# Patient Record
Sex: Male | Born: 1985 | Hispanic: No | Smoking: Never smoker
Health system: Southern US, Community
[De-identification: ages and names within clinical notes are randomized; demographics above are authoritative.]

## PROBLEM LIST (undated history)

## (undated) DIAGNOSIS — M545 Low back pain, unspecified: Secondary | ICD-10-CM

## (undated) DIAGNOSIS — F32A Depression, unspecified: Secondary | ICD-10-CM

## (undated) DIAGNOSIS — G4700 Insomnia, unspecified: Secondary | ICD-10-CM

## (undated) DIAGNOSIS — F319 Bipolar disorder, unspecified: Secondary | ICD-10-CM

## (undated) DIAGNOSIS — F1910 Other psychoactive substance abuse, uncomplicated: Secondary | ICD-10-CM

## (undated) DIAGNOSIS — F329 Major depressive disorder, single episode, unspecified: Secondary | ICD-10-CM

## (undated) HISTORY — DX: Insomnia, unspecified: G47.00

## (undated) HISTORY — PX: HAND SURGERY: SHX662

## (undated) HISTORY — PX: TOTAL HIP ARTHROPLASTY: SHX124

## (undated) HISTORY — DX: Low back pain, unspecified: M54.50

## (undated) HISTORY — DX: Low back pain: M54.5

---
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
## 2010-03-23 ENCOUNTER — Emergency Department (HOSPITAL_COMMUNITY)
Admission: EM | Admit: 2010-03-23 | Discharge: 2010-03-24 | Payer: Self-pay | Source: Home / Self Care | Admitting: Emergency Medicine

## 2010-06-02 LAB — URINE MICROSCOPIC-ADD ON

## 2010-06-02 LAB — URINALYSIS, ROUTINE W REFLEX MICROSCOPIC
Bilirubin Urine: NEGATIVE
Hgb urine dipstick: NEGATIVE
Protein, ur: 30 mg/dL — AB
Urobilinogen, UA: 0.2 mg/dL (ref 0.0–1.0)

## 2013-08-29 ENCOUNTER — Encounter (INDEPENDENT_AMBULATORY_CARE_PROVIDER_SITE_OTHER): Payer: Self-pay

## 2013-08-29 ENCOUNTER — Encounter: Payer: Self-pay | Admitting: Neurology

## 2013-08-29 ENCOUNTER — Ambulatory Visit (INDEPENDENT_AMBULATORY_CARE_PROVIDER_SITE_OTHER): Payer: Medicare HMO | Admitting: Neurology

## 2013-08-29 VITALS — BP 112/73 | HR 72 | Ht 68.0 in | Wt 237.0 lb

## 2013-08-29 DIAGNOSIS — M79604 Pain in right leg: Secondary | ICD-10-CM | POA: Insufficient documentation

## 2013-08-29 DIAGNOSIS — M545 Low back pain, unspecified: Secondary | ICD-10-CM | POA: Insufficient documentation

## 2013-08-29 DIAGNOSIS — M79609 Pain in unspecified limb: Secondary | ICD-10-CM

## 2013-08-29 DIAGNOSIS — G4700 Insomnia, unspecified: Secondary | ICD-10-CM

## 2013-08-29 MED ORDER — PREGABALIN 100 MG PO CAPS: 100.0000 mg | ORAL_CAPSULE | Freq: Three times a day (TID) | ORAL | Status: DC
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
## 2013-09-04 ENCOUNTER — Telehealth: Payer: Self-pay

## 2013-09-04 NOTE — Telephone Encounter (Signed)
[employer] sent us a letter saying they have approved our request for coverage on Lyrica effective until 03/22/2014 Ref Fax [id number] 10/2012 [id number]  [phone]

## 2013-09-12 ENCOUNTER — Telehealth: Payer: Self-pay

## 2013-09-12 NOTE — Telephone Encounter (Signed)
Dr.Yan needs to do a Peer to Peer [phone] option # 4

## 2013-09-12 NOTE — Telephone Encounter (Signed)
I have done peer to peer review,   Authorization will be faxed to our office. For MRI lumbar

## 2013-09-18 ENCOUNTER — Ambulatory Visit
Admission: RE | Admit: 2013-09-18 | Discharge: 2013-09-18 | Disposition: A | Discharge status: Home / Self Care | Payer: Medicare HMO | Source: Ambulatory Visit | Attending: Neurology | Admitting: Neurology

## 2013-09-18 ENCOUNTER — Ambulatory Visit (INDEPENDENT_AMBULATORY_CARE_PROVIDER_SITE_OTHER): Payer: Medicare HMO | Admitting: Neurology

## 2013-09-18 ENCOUNTER — Encounter (INDEPENDENT_AMBULATORY_CARE_PROVIDER_SITE_OTHER): Payer: Self-pay

## 2013-09-18 DIAGNOSIS — M79604 Pain in right leg: Secondary | ICD-10-CM

## 2013-09-18 DIAGNOSIS — G4700 Insomnia, unspecified: Secondary | ICD-10-CM

## 2013-09-18 DIAGNOSIS — M545 Low back pain, unspecified: Secondary | ICD-10-CM

## 2013-09-18 DIAGNOSIS — M79609 Pain in unspecified limb: Secondary | ICD-10-CM

## 2013-09-18 DIAGNOSIS — Z0289 Encounter for other administrative examinations: Secondary | ICD-10-CM
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
## 2013-09-19 ENCOUNTER — Telehealth: Payer: Self-pay | Admitting: Neurology

## 2013-09-19 NOTE — Telephone Encounter (Signed)
Please call patient, MRI of lumbar spine showed mild multilevel degenerative disc disease, but no significant canal, or foraminal stenosis, I will refer him to pain management   Abnormal MRI lumbar spine (without) demonstrating:  1. At L4-5: disc bulging and facet hypertrophy with mild right foraminal stenosis  2. At L5-S1: small central disc protrusion with potential contact upon descending left S1 root without displacement

## 2013-09-26 NOTE — Telephone Encounter (Signed)
Called pt to inform him per Dr. Terrace ArabiaYan the the pt's MRI of the lumbar spine showed mild multilevel degenerative disc disease, but no significant canal, or foraminal stenosis and that Dr. Terrace ArabiaYan has referred pt to a pain management clinic. I advised the pt that if he has any other problems, questions or concerns to call the office. Pt verbalized understanding.

## 2015-02-22 ENCOUNTER — Encounter (HOSPITAL_COMMUNITY): Payer: Self-pay | Admitting: Emergency Medicine

## 2015-02-22 ENCOUNTER — Emergency Department (HOSPITAL_COMMUNITY)
Admission: EM | Admit: 2015-02-22 | Discharge: 2015-02-22 | Disposition: A | Discharge status: Home / Self Care | Payer: Medicare HMO | Attending: Emergency Medicine | Admitting: Emergency Medicine

## 2015-02-22 DIAGNOSIS — Z791 Long term (current) use of non-steroidal anti-inflammatories (NSAID): Secondary | ICD-10-CM | POA: Diagnosis not present

## 2015-02-22 DIAGNOSIS — G4700 Insomnia, unspecified: Secondary | ICD-10-CM | POA: Insufficient documentation

## 2015-02-22 DIAGNOSIS — Z7951 Long term (current) use of inhaled steroids: Secondary | ICD-10-CM | POA: Diagnosis not present

## 2015-02-22 DIAGNOSIS — Z79899 Other long term (current) drug therapy: Secondary | ICD-10-CM | POA: Diagnosis not present

## 2015-02-22 DIAGNOSIS — L02512 Cutaneous abscess of left hand: Secondary | ICD-10-CM | POA: Diagnosis not present

## 2015-02-22 DIAGNOSIS — IMO0001 Reserved for inherently not codable concepts without codable children: Secondary | ICD-10-CM

## 2015-02-22 DIAGNOSIS — Z87828 Personal history of other (healed) physical injury and trauma: Secondary | ICD-10-CM | POA: Diagnosis not present

## 2015-02-22 DIAGNOSIS — M79645 Pain in left finger(s): Secondary | ICD-10-CM | POA: Diagnosis present

## 2015-02-22 MED ORDER — SULFAMETHOXAZOLE-TRIMETHOPRIM 800-160 MG PO TABS
1.0000 | ORAL_TABLET | Freq: Once | ORAL | Status: AC
  Administered 2015-02-22: 1 via ORAL
  Filled 2015-02-22: qty 1

## 2015-02-22 MED ORDER — HYDROCODONE-ACETAMINOPHEN 5-325 MG PO TABS: 1.0000 | ORAL_TABLET | Freq: Four times a day (QID) | ORAL | Status: DC | PRN

## 2015-02-22 MED ORDER — HYDROCODONE-ACETAMINOPHEN 5-325 MG PO TABS
2.0000 | ORAL_TABLET | Freq: Once | ORAL | Status: AC
Start: 2015-02-22 — End: 2015-02-22
  Administered 2015-02-22: 2 via ORAL
  Filled 2015-02-22: qty 2

## 2015-02-22 MED ORDER — SULFAMETHOXAZOLE-TRIMETHOPRIM 800-160 MG PO TABS: 1.0000 | ORAL_TABLET | Freq: Two times a day (BID) | ORAL | Status: AC

## 2015-02-22 NOTE — Discharge Instructions (Signed)
Please do not eat or drink after 5 AM. Please call Dr. Merrilee SeashoreKuzma's office at 8 AM for an appointment.   Abscess An abscess is an infected area that contains a collection of pus and debris.It can occur in almost any part of the body. An abscess is also known as a furuncle or boil. CAUSES  An abscess occurs when tissue gets infected. This can occur from blockage of oil or sweat glands, infection of hair follicles, or a minor injury to the skin. As the body tries to fight the infection, pus collects in the area and creates pressure under the skin. This pressure causes pain. People with weakened immune systems have difficulty fighting infections and get certain abscesses more often.  SYMPTOMS Usually an abscess develops on the skin and becomes a painful mass that is red, warm, and tender. If the abscess forms under the skin, you may feel a moveable soft area under the skin. Some abscesses break open (rupture) on their own, but most will continue to get worse without care. The infection can spread deeper into the body and eventually into the bloodstream, causing you to feel ill.  DIAGNOSIS  Your caregiver will take your medical history and perform a physical exam. A sample of fluid may also be taken from the abscess to determine what is causing your infection. TREATMENT  Your caregiver may prescribe antibiotic medicines to fight the infection. However, taking antibiotics alone usually does not cure an abscess. Your caregiver may need to make a small cut (incision) in the abscess to drain the pus. In some cases, gauze is packed into the abscess to reduce pain and to continue draining the area. HOME CARE INSTRUCTIONS   Only take over-the-counter or prescription medicines for pain, discomfort, or fever as directed by your caregiver.  If you were prescribed antibiotics, take them as directed. Finish them even if you start to feel better.  If gauze is used, follow your caregiver's directions for changing the  gauze.  To avoid spreading the infection:  Keep your draining abscess covered with a bandage.  Wash your hands well.  Do not share personal care items, towels, or whirlpools with others.  Avoid skin contact with others.  Keep your skin and clothes clean around the abscess.  Keep all follow-up appointments as directed by your caregiver. SEEK MEDICAL CARE IF:   You have increased pain, swelling, redness, fluid drainage, or bleeding.  You have muscle aches, chills, or a general ill feeling.  You have a fever. MAKE SURE YOU:   Understand these instructions.  Will watch your condition.  Will get help right away if you are not doing well or get worse.   This information is not intended to replace advice given to you by your health care provider. Make sure you discuss any questions you have with your health care provider.   Document Released: 12/17/2004 Document Revised: 09/08/2011 Document Reviewed: 05/22/2011 Elsevier Interactive Patient Education [employer]2016 Elsevier Inc.

## 2015-02-22 NOTE — ED Notes (Signed)
EDP explained plan of care to pt.  

## 2015-02-22 NOTE — ED Notes (Signed)
Pt. presents with left index finger skin infection with swelling and drainage for several days , pt. stated it was accidentally cut with a knife 3 weeks ago.

## 2015-02-22 NOTE — ED Provider Notes (Signed)
By signing my name below, I, Tanda Rockers, attest that this documentation has been prepared under the direction and in the presence of [employer], DO. Electronically Signed: Tanda Rockers, ED Scribe. 02/22/2015. 2:25 AM.  TIME SEEN: 2:14 AM  CHIEF COMPLAINT: Finger pain  HPI:  Thomas [name] is a 29 y.o. male who is right hand dominant presents to the Emergency Department complaining of gradual onset, constant, left index finger pain, swelling, and drainage x 1 week. Pt states that he cut his finger approximately 3-4 weeks ago with a knife while cleaning a deer and had sutures placed at [facility]. He had his sutures taken out on 02/14/2015 (approximately 1 week ago) by his PCP and states that at that time it was slightly red. His PCP scheduled him to follow-up with an orthopedic physician at [facility] in [facility] and he has an appointment on December 15.  He was not started on antibiotics. States that his finger began to swell more over the next several days and was erythematous and began to drain. Denies fever, chills, nausea, vomiting or diarrhea. No hx DM or IV drug use. States he is having pain with flexion and extension of this finger.  ROS: See HPI Constitutional: no fever, chills Eyes: no drainage  ENT: no runny nose   Cardiovascular:  no chest pain  Resp: no SOB  GI: no vomiting GU: no dysuria Integumentary: swelling and drainage to left index finger. no rash  Allergy: no hives  Musculoskeletal: pain to left index finger. no leg swelling  Neurological: no slurred speech ROS otherwise negative  PAST MEDICAL HISTORY/PAST SURGICAL HISTORY:  Past Medical History  Diagnosis Date  . LBP (low back pain)   . Insomnia     MEDICATIONS:  Prior to Admission medications   Medication Sig Start Date End Date Taking? Authorizing Provider  amphetamine-dextroamphetamine (ADDERALL) 10 MG tablet 10 mg daily. 08/05/13   Historical Provider, MD  fluticasone  (FLONASE) 50 MCG/ACT nasal spray 50 sprays. 07/05/13   Historical Provider, MD  meloxicam (MOBIC) 15 MG tablet 15 mg daily. 08/04/13   Historical Provider, MD  pregabalin (LYRICA) 100 MG capsule Take 1 capsule (100 mg total) by mouth 3 (three) times daily. 08/29/13   Levert Feinstein, MD  temazepam (RESTORIL) 15 MG capsule 15 capsules daily. 07/05/13   Historical Provider, MD  traMADol (ULTRAM) 50 MG tablet 50 mg daily. 08/04/13   Historical Provider, MD    ALLERGIES:  No Known Allergies  SOCIAL HISTORY:  Social History  Substance Use Topics  . Smoking status: Never Smoker   . Smokeless tobacco: Never Used  . Alcohol Use: No     Comment: Quit 2014    FAMILY HISTORY: Family History  Problem Relation Age of Onset  . Heart disease Maternal Grandmother   . Heart disease Maternal Grandfather   . Sleep apnea Mother   . Diabetes Mother     EXAM: Triage Vitals: BP 122/92 mmHg  Pulse 79  Temp(Src) 98.1 F (36.7 C) (Oral)  Resp 18  SpO2 97%   CONSTITUTIONAL: Alert and oriented and responds appropriately to questions. Well-appearing; well-nourished HEAD: Normocephalic EYES: Conjunctivae clear, PERRL ENT: normal nose; no rhinorrhea; moist mucous membranes; pharynx without lesions noted NECK: Supple, no meningismus, no LAD  CARD: RRR; S1 and S2 appreciated; no murmurs, no clicks, no rubs, no gallops RESP: Normal chest excursion without splinting or tachypnea; breath sounds clear and equal bilaterally; no wheezes, no rhonchi, no rales, no hypoxia  or respiratory distress, speaking full sentences ABD/GI: Normal bowel sounds; non-distended; soft, non-tender, no rebound, no guarding, no peritoneal signs BACK:  The back appears normal and is non-tender to palpation, there is no CVA tenderness EXT: Left second digit over the PIP joint there is a swollen, fluctuant, erythematous, warm area with a punctate lesion with purulent drainage. He has difficulty fully flexing and extending finger secondary to  pain. No tenderness over flexor tendon of that digit. 2+ radial pulse on the left. normal capillary refill; no cyanosis, no calf tenderness or swelling, no bony tenderness or bony injury on exam    SKIN: Normal color for age and race; warm NEURO: Moves all extremities equally, sensation to light touch intact diffusely, cranial nerves II through XII intact PSYCH: The patient's mood and manner are appropriate. Grooming and personal hygiene are appropriate.        MEDICAL DECISION MAKING: Patient here with abscess over the PIP of the left second digit that is actively draining. Discussed with Dr. Betha LoaKevin Kuzma on call for hand surgery. He recommends starting the patient on Bactrim and placing him in a splint. He recommend having the patient return to his office in the morning for further intervention. We'll keep patient NPO after 5 AM. Patient last ate [employer] prior to arrival. [facility] 10 Vicodin tablets. Provided work note until Monday 02/25/15.  Discussed with patient that he must follow-up with the surgeon today. He verbalizes understanding and states he is comfortable with this plan.    I personally performed the services described in this documentation, which was scribed in my presence. The recorded information has been reviewed and is accurate.    Layla MawKristen N Ward, DO 02/22/15 [id number]

## 2016-02-27 ENCOUNTER — Emergency Department (HOSPITAL_COMMUNITY)
Admission: EM | Admit: 2016-02-27 | Discharge: 2016-02-27 | Disposition: A | Discharge status: Other Acute Inpatient Hospital | Payer: Medicare Other | Attending: Emergency Medicine | Admitting: Emergency Medicine

## 2016-02-27 ENCOUNTER — Encounter (HOSPITAL_COMMUNITY): Payer: Self-pay

## 2016-02-27 DIAGNOSIS — F1410 Cocaine abuse, uncomplicated: Secondary | ICD-10-CM | POA: Diagnosis not present

## 2016-02-27 DIAGNOSIS — F332 Major depressive disorder, recurrent severe without psychotic features: Secondary | ICD-10-CM | POA: Diagnosis present

## 2016-02-27 DIAGNOSIS — R45851 Suicidal ideations: Secondary | ICD-10-CM | POA: Diagnosis present

## 2016-02-27 DIAGNOSIS — F1419 Cocaine abuse with unspecified cocaine-induced disorder: Secondary | ICD-10-CM | POA: Diagnosis present

## 2016-02-27 DIAGNOSIS — Z79899 Other long term (current) drug therapy: Secondary | ICD-10-CM | POA: Diagnosis not present

## 2016-02-27 LAB — COMPREHENSIVE METABOLIC PANEL
ALBUMIN: 4.3 g/dL (ref 3.5–5.0)
ALK PHOS: 60 U/L (ref 38–126)
ALT: 69 U/L — AB (ref 17–63)
AST: 38 U/L (ref 15–41)
Anion gap: 8 (ref 5–15)
BUN: 15 mg/dL (ref 6–20)
CALCIUM: 9.3 mg/dL (ref 8.9–10.3)
CO2: 26 mmol/L (ref 22–32)
CREATININE: 1.06 mg/dL (ref 0.61–1.24)
Chloride: 103 mmol/L (ref 101–111)
GFR calc Af Amer: >60 mL/min (ref >60)
GFR calc non Af Amer: >60 mL/min (ref >60)
GLUCOSE: 105 mg/dL — AB (ref 65–99)
Potassium: 3.8 mmol/L (ref 3.5–5.1)
SODIUM: 137 mmol/L (ref 135–145)
Total Bilirubin: 0.6 mg/dL (ref 0.3–1.2)
Total Protein: 7.3 g/dL (ref 6.5–8.1)

## 2016-02-27 LAB — CBC
HEMATOCRIT: 42.2 % (ref 39.0–52.0)
HEMOGLOBIN: 14.5 g/dL (ref 13.0–17.0)
MCH: 30 pg (ref 26.0–34.0)
MCHC: 34.4 g/dL (ref 30.0–36.0)
MCV: 87.4 fL (ref 78.0–100.0)
Platelets: 219 10*3/uL (ref 150–400)
RBC: 4.83 MIL/uL (ref 4.22–5.81)
RDW: 12.7 % (ref 11.5–15.5)
WBC: 8.7 10*3/uL (ref 4.0–10.5)

## 2016-02-27 LAB — RAPID URINE DRUG SCREEN, HOSP PERFORMED
Amphetamines: NOT DETECTED
BARBITURATES: NOT DETECTED
Benzodiazepines: NOT DETECTED
Cocaine: POSITIVE — AB
Opiates: NOT DETECTED
TETRAHYDROCANNABINOL: POSITIVE — AB

## 2016-02-27 LAB — ACETAMINOPHEN LEVEL: Acetaminophen (Tylenol), Serum: <10 ug/mL — ABNORMAL LOW (ref 10–30)

## 2016-02-27 LAB — SALICYLATE LEVEL: Salicylate Lvl: <7 mg/dL (ref 2.8–30.0)

## 2016-02-27 LAB — ETHANOL: Alcohol, Ethyl (B): <5 mg/dL (ref <5)

## 2016-02-27 MED ORDER — LORAZEPAM 1 MG PO TABS: 1.0000 mg | ORAL_TABLET | Freq: Three times a day (TID) | ORAL | Status: DC | PRN

## 2016-02-27 MED ORDER — ZOLPIDEM TARTRATE 5 MG PO TABS: 5.0000 mg | ORAL_TABLET | Freq: Every evening | ORAL | Status: DC | PRN

## 2016-02-27 MED ORDER — ONDANSETRON HCL 4 MG PO TABS: 4.0000 mg | ORAL_TABLET | Freq: Three times a day (TID) | ORAL | Status: DC | PRN

## 2016-02-27 MED ORDER — NICOTINE 21 MG/24HR TD PT24: 21.0000 mg | MEDICATED_PATCH | Freq: Every day | TRANSDERMAL | Status: DC

## 2016-02-27 MED ORDER — ACETAMINOPHEN 325 MG PO TABS: 650.0000 mg | ORAL_TABLET | ORAL | Status: DC | PRN

## 2016-02-27 MED ORDER — ALUM & MAG HYDROXIDE-SIMETH 200-200-20 MG/5ML PO SUSP: 30.0000 mL | ORAL | Status: DC | PRN

## 2016-02-27 MED ORDER — IBUPROFEN 200 MG PO TABS: 600.0000 mg | ORAL_TABLET | Freq: Three times a day (TID) | ORAL | Status: DC | PRN

## 2016-02-27 NOTE — BH Assessment (Signed)
[facility]  Per Jacki ConesLaurie, NP - patient meets criteria for inpatient hospitalization once he is medically cleared.  Per NP, [facility] to using [facility] yesterday.    Writer informed the Charge Nurse Johnny Bridge(Martha) that [facility] once [facility] is medically [facility].

## 2016-02-27 NOTE — ED Provider Notes (Signed)
[facility]-EMERGENCY DEPT Provider Note   CSN: [id number] Arrival date & time: 02/27/16  1329     History   Chief Complaint Chief Complaint  Patient presents with  . Suicidal  . Drug Problem    HPI Thomas [name] is a 30 y.o. male.  Pt presents to the ED with suicidal ideation and crack abuse.  Pt said that he has been using crack for 2 months and he can't quit by himself.  He feels suicidal due to inability to stop using.  Pt denies a plan.  He has not tried to hurt himself.      Past Medical History:  Diagnosis Date  . Insomnia   . LBP (low back pain)     Patient Active Problem List   Diagnosis Date Noted  . Right leg pain 08/29/2013  . LBP (low back pain)   . Insomnia     Past Surgical History:  Procedure Laterality Date  . HAND SURGERY Right   . TOTAL HIP ARTHROPLASTY Right        Home Medications    Prior to Admission medications   Medication Sig Start Date End Date Taking? Authorizing Provider  pregabalin (LYRICA) 100 MG capsule Take 1 capsule (100 mg total) by mouth 3 (three) times daily. Patient not taking: Reported on 02/27/2016 08/29/13   Levert FeinsteinYijun Yan, MD    Family History Family History  Problem Relation Age of Onset  . Sleep apnea Mother   . Diabetes Mother   . Heart disease Maternal Grandmother   . Heart disease Maternal Grandfather     Social History Social History  Substance Use Topics  . Smoking status: Never Smoker  . Smokeless tobacco: Never Used  . Alcohol use No     Comment: Quit 2014     Allergies   Patient has no known allergies.   Review of Systems Review of Systems  Psychiatric/Behavioral: Positive for suicidal ideas.  All other systems reviewed and are negative.    Physical Exam Updated Vital Signs BP 125/69 (BP Location: Left Arm)   Pulse 81   Temp 98.2 F (36.8 C) (Oral)   Resp 20   SpO2 96%   Physical Exam  Constitutional: He is oriented to person, place, and time. He appears well-developed and  well-nourished.  HENT:  Head: Normocephalic and atraumatic.  Right Ear: External ear normal.  Left Ear: External ear normal.  Nose: Nose normal.  Mouth/Throat: Oropharynx is clear and moist.  Eyes: Conjunctivae and EOM are normal. Pupils are equal, round, and reactive to light.  Neck: Normal range of motion. Neck supple.  Cardiovascular: Normal rate, regular rhythm, normal heart sounds and intact distal pulses.   Pulmonary/Chest: Effort normal and breath sounds normal.  Abdominal: Soft. Bowel sounds are normal.  Musculoskeletal: Normal range of motion.  Neurological: He is alert and oriented to person, place, and time.  Skin: Skin is warm.  Psychiatric: He has a normal mood and affect. His behavior is normal. Judgment and thought content normal.  Nursing note and vitals reviewed.    ED Treatments / Results  Labs (all labs ordered are listed, but only abnormal results are displayed) Labs Reviewed  COMPREHENSIVE METABOLIC PANEL - Abnormal; Notable for the following:       Result Value   Glucose, Bld 105 (*)    ALT 69 (*)    All other components within normal limits  ACETAMINOPHEN LEVEL - Abnormal; Notable for the following:    Acetaminophen (Tylenol), Serum <10 (*)  All other components within normal limits  ETHANOL  SALICYLATE LEVEL  CBC  RAPID URINE DRUG SCREEN, HOSP PERFORMED    EKG  EKG Interpretation None       Radiology No results found.  Procedures Procedures (including critical care time)  Medications Ordered in ED Medications  LORazepam (ATIVAN) tablet 1 mg (not administered)  acetaminophen (TYLENOL) tablet 650 mg (not administered)  ibuprofen (ADVIL,MOTRIN) tablet 600 mg (not administered)  zolpidem (AMBIEN) tablet 5 mg (not administered)  nicotine (NICODERM CQ - dosed in mg/24 hours) patch 21 mg (not administered)  ondansetron (ZOFRAN) tablet 4 mg (not administered)  alum & mag hydroxide-simeth (MAALOX/MYLANTA) 200-200-20 MG/5ML suspension 30 mL  (not administered)     Initial Impression / Assessment and Plan / ED Course  I have reviewed the triage vital signs and the nursing notes.  Pertinent labs & imaging results that were available during my care of the patient were reviewed by me and considered in my medical decision making (see chart for details).  Clinical Course    Pt is here with crack abuse.  The pt reports SI, so I will have TTS eval.  Pt placed in a psych hold.  Final Clinical Impressions(s) / ED Diagnoses   Final diagnoses:  Cocaine abuse  Suicidal ideations    New Prescriptions New Prescriptions   No medications on file     Jacalyn LefevreJulie Paytience Bures, MD 02/27/16 1517

## 2016-02-27 NOTE — ED Notes (Signed)
Patient noted in room. No complaints, stable, in no acute distress. Q15 minute rounds and monitoring via Security Cameras to continue.  

## 2016-02-27 NOTE — BH Assessment (Signed)
Tele Assessment Note   Thomas [name] is an 30 y.o. male that reports suicidal ideation with a plan to cut his wrist.  Patient reports that he is addicted to cocaine.  Patient reports increased depression with the inability to stop using cocaine.  Patient reports feeling hopeless and he is not able to contract for safety.   Patient reports that his last use of cocaine was yesterday.  Patient reports using cocaine on a daily basis.  Patient reports that he has been experiencing withdrawal symptoms.  Patient denies seizures.  Patient denies prior inpatient or outpatient mental health therapy.  Patient denies prior inpatient psychiatric hospitalization.  Patient denies prior outpatient psychiatric medication management or mental health therapy.    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 Insomnia   . LBP (low back pain)     Past Surgical History:  Procedure Laterality Date  . HAND SURGERY Right   . TOTAL HIP ARTHROPLASTY Right     Family History:  Family History  Problem Relation Age of Onset  . Heart disease Maternal Grandmother   . Heart disease Maternal Grandfather   . Sleep apnea Mother   . Diabetes Mother     Social History:  reports that he has never smoked. He has never used smokeless tobacco. He reports that he does not drink alcohol or use drugs.  Additional Social History:  Alcohol / Drug Use History of alcohol / drug use?: Yes Longest period of sobriety (when/how long): Patient reports a couple of days Negative Consequences of Use: Work / Programmer, multimediachool, Copywriter, advertisingersonal relationships, Surveyor, quantityinancial Withdrawal Symptoms: Irritability, Tingling, Nausea / Vomiting, Patient aware of relationship between substance abuse and physical/medical complications, Blackouts, Weakness Substance #1 Name of Substance 1: Cocaine 1 - Age of First Use: 18 1 - Amount (size/oz): Varies on how much  money he has 1 - Frequency: Daily 1 - Duration: For the past couple of years, per the patient  1 - Last Use / Amount: Last night - he used $80 worth   CIWA:   COWS:    PATIENT STRENGTHS: (choose at least two) Average or above average intelligence Capable of independent living Communication skills  Allergies: No Known Allergies  Home Medications:  (Not in a hospital admission)  OB/GYN Status:  No LMP for male patient.  General Assessment Data [facility] TTS Assessment: In system Is this a Tele or Face-to-Face Assessment?: Face-to-Face Is this an Initial Assessment or a Re-assessment for this encounter?: Initial Assessment Marital status: Single Maiden name: NA Is patient pregnant?: No Pregnancy Status: No Living Arrangements: Alone Can pt return to current living arrangement?: Yes Admission Status: Voluntary Is patient capable of signing voluntary admission?: Yes Referral Source: Self/Family/Friend Insurance type: Medicare  Medical Screening Exam [facility] ONLY) Medical Exam completed: Yes Elta Guadeloupe(Laurie Parks, NP - completed MSE Exam )  Crisis Care Plan Living Arrangements: Alone Legal Guardian:  (NA) Name of Psychiatrist: None Reported Name of Therapist: None Reported  Education Status Is patient currently in school?: No Current Grade: NA Highest grade of school patient has completed: NA Name of school: NA Contact person: NA  Risk to self with the past 6 months Suicidal Ideation: Yes-Currently Present Has patient been a risk to self within the past 6 months prior to admission? : Yes Suicidal Intent: Yes-Currently Present Has patient had any suicidal intent within  the past 6 months prior to admission? : Yes Is patient at risk for suicide?: Yes Suicidal Plan?: Yes-Currently Present Has patient had any suicidal plan within the past 6 months prior to admission? : Yes Specify Current Suicidal Plan: Plan to cut  his wrist Access to Means: Yes Specify Access to Suicidal Means: Knife What has been your use of drugs/alcohol within the last 12 months?: Cocaine Previous Attempts/Gestures: No How many times?: 0 Other Self Harm Risks: None Reported Triggers for Past Attempts:  (NA) Intentional Self Injurious Behavior: None Family Suicide History: No Recent stressful life event(s): Other (Comment) (Unable to stip using drugs) Persecutory voices/beliefs?: No Depression: Yes Depression Symptoms: Despondent, Insomnia, Tearfulness, Isolating, Fatigue, Guilt, Loss of interest in usual pleasures, Feeling angry/irritable, Feeling worthless/self pity Substance abuse history and/or treatment for substance abuse?: Yes Suicide prevention information given to non-admitted patients: Not applicable  Risk to Others within the past 6 months Homicidal Ideation: No Does patient have any lifetime risk of violence toward others beyond the six months prior to admission? : No Thoughts of Harm to Others: No Current Homicidal Intent: No Current Homicidal Plan: No Access to Homicidal Means: No Identified Victim: None Reported History of harm to others?: No Assessment of Violence: None Noted Violent Behavior Description: None Reported Does patient have access to weapons?: No Criminal Charges Pending?: No Does patient have a court date: No Is patient on probation?: No  Psychosis Hallucinations: None noted Delusions: None noted  Mental Status Report Appearance/Hygiene: Disheveled Eye Contact: Fair Motor Activity: Freedom of movement Speech: Logical/coherent Level of Consciousness: Alert Mood: Depressed Affect: Anxious, Depressed, Blunted, Irritable Anxiety Level: Minimal Thought Processes: Coherent, Relevant Judgement: Impaired Orientation: Person, Place, Time, Situation Obsessive Compulsive Thoughts/Behaviors: None  Cognitive Functioning Concentration: Decreased Memory: Recent Intact, Remote Intact IQ:  Average Insight: Poor Impulse Control: Poor Appetite: Fair Weight Loss: 0 Weight Gain: 0 Sleep: Decreased Total Hours of Sleep: 4 Vegetative Symptoms: Decreased grooming, Not bathing, Staying in bed  ADLScreening [facility] Assessment Services) Patient's cognitive ability adequate to safely complete daily activities?: Yes Patient able to express need for assistance with ADLs?: Yes Independently performs ADLs?: Yes (appropriate for developmental age)  Prior Inpatient Therapy Prior Inpatient Therapy: No Prior Therapy Dates: NA Prior Therapy Facilty/Provider(s): NA Reason for Treatment: NA  Prior Outpatient Therapy Prior Outpatient Therapy: No Prior Therapy Dates: NA Prior Therapy Facilty/Provider(s): NA Reason for Treatment: NA Does patient have an ACCT team?: No Does patient have Intensive In-House Services?  : No Does patient have [facility] services? : No Does patient have P4CC services?: No  ADL Screening (condition at time of admission) Patient's cognitive ability adequate to safely complete daily activities?: Yes Is the patient deaf or have difficulty hearing?: No Does the patient have difficulty seeing, even when wearing glasses/contacts?: No Does the patient have difficulty concentrating, remembering, or making decisions?: No Patient able to express need for assistance with ADLs?: Yes Does the patient have difficulty dressing or bathing?: No Independently performs ADLs?: Yes (appropriate for developmental age) Does the patient have difficulty walking or climbing stairs?: No Weakness of Legs: None Weakness of Arms/Hands: None  Home Assistive Devices/Equipment Home Assistive Devices/Equipment: None    Abuse/Neglect Assessment (Assessment to be complete while patient is alone) Physical Abuse: Denies Verbal Abuse: Denies Sexual Abuse: Denies Exploitation of patient/patient's resources: Denies Self-Neglect: Denies Values / Beliefs Cultural Requests During  Hospitalization: None Spiritual Requests During Hospitalization: None Consults Spiritual Care Consult Needed: No Social Work Consult Needed: No Merchant navy officer (For Healthcare)  Does Patient Have a Medical Advance Directive?: No Would patient like information on creating a medical advance directive?: No - Patient declined    Additional Information 1:1 In Past 12 Months?: No CIRT Risk: No Elopement Risk: No Does patient have medical clearance?: No  Child/Adolescent Assessment Running Away Risk: Denies  Disposition: Per Elta GuadeloupeLaurie Parks, NP - patient meets criteria for inpatient hospitalization.  [facility].    Disposition Initial Assessment Completed for this Encounter: Yes Disposition of Patient: Inpatient treatment program (Per Camelia EngLaure, patient meets criteria for inpt hosp) Type of inpatient treatment program: Adult  Thomas [name], Thomas [name] 02/27/2016 1:35 PM

## 2016-02-27 NOTE — H&P (Signed)
Behavioral Health Medical Screening Exam  Thomas [name] is an 30 y.o. male.  Total Time spent with patient: 20 minutes  Psychiatric Specialty Exam: Physical Exam  Constitutional: He is oriented to person, place, and time. He appears well-developed and well-nourished.  HENT:  Head: Normocephalic.  Neck: Normal range of motion.  Cardiovascular: Normal rate and normal heart sounds.   Respiratory: Effort normal and breath sounds normal.  GI: Soft. Bowel sounds are normal.  Musculoskeletal: Normal range of motion.  Neurological: He is alert and oriented to person, place, and time.  Skin: Skin is warm and dry.    Review of Systems  Psychiatric/Behavioral: Positive for depression, substance abuse and suicidal ideas. Negative for hallucinations and memory loss. The patient is nervous/anxious. The patient does not have insomnia.     Blood pressure 133/77, pulse 82, temperature 98.1 F (36.7 C), temperature source Oral, resp. rate 18, SpO2 98 %.There is no height or weight on file to calculate BMI.  General Appearance: Casual and Fairly Groomed  Eye Contact:  Good  Speech:  Clear and Coherent and Normal Rate  Volume:  Normal  Mood:  Depressed, Hopeless and Worthless  Affect:  Congruent, Depressed, Flat and Tearful  Thought Process:  Coherent, Goal Directed and Linear  Orientation:  Full (Time, Place, and Person)  Thought Content:  Logical  Suicidal Thoughts:  Yes.  with intent/plan  Homicidal Thoughts:  No  Memory:  Immediate;   Good Recent;   Fair Remote;   Fair  Judgement:  Fair  Insight:  Fair  Psychomotor Activity:  Normal  Concentration: Concentration: Good and Attention Span: Good  Recall:  Good  Fund of Knowledge:Good  Language: Good  Akathisia:  No  Handed:  Right  AIMS (if indicated):     Assets:  Communication Skills Desire for Improvement Financial Resources/Insurance Housing Leisure Time Resilience Social Support Transportation  Sleep:        Musculoskeletal: Strength & Muscle Tone: within normal limits Gait & Station: normal Patient leans: N/A  Blood pressure 133/77, pulse 82, temperature 98.1 F (36.7 C), temperature source Oral, resp. rate 18, SpO2 98 %.  Recommendations:  Based on my evaluation the patient appears to have an emergency medical condition for which I recommend the patient be transferred to the emergency department for further evaluation.   Pt meet criteria for inpatient psychiatric admission  Laveda AbbeLaurie Britton Kolton Kienle, NP 02/27/2016, 5:56 PM

## 2016-02-27 NOTE — ED Notes (Signed)
Pt admitted to room #42. Pt behavior calm and cooperative, pleasant on approach. Pt reports he came to the hospital d/t "drug addiction to crack cocaine." Pt endorsing SI. "If I don't get no help I have a plan." Pt denies HI. Denies AVH. Pt identifies current stressor as "my grandma dying." Special checks q 15 mins in place for safety. Video monitoring in place.

## 2016-02-27 NOTE — ED Triage Notes (Signed)
Per pt, has a drug addiction of crack/cocaine.  Using for 2 months.  Thoughts of hurting self for approx 1 month.  No plan.  States if he "doesn't get help, there will be a plan".

## 2016-02-27 NOTE — ED Provider Notes (Signed)
[facility] accepted by Dr. Mckinley JewelATES. I personally evaluated the patient and felt he was safe for transfer. Patient denied any acute physical complaints.   Nira ConnPedro Eduardo Cardama, MD 02/28/16 [phone]

## 2016-02-28 ENCOUNTER — Encounter (HOSPITAL_COMMUNITY): Payer: Self-pay

## 2016-02-28 ENCOUNTER — Inpatient Hospital Stay (HOSPITAL_COMMUNITY)
Admission: RE | Admit: 2016-02-28 | Discharge: 2016-03-03 | DRG: 885 | Disposition: A | Discharge status: Home / Self Care | Payer: Medicare Other | Attending: Psychiatry | Admitting: Psychiatry

## 2016-02-28 DIAGNOSIS — Z833 Family history of diabetes mellitus: Secondary | ICD-10-CM | POA: Diagnosis not present

## 2016-02-28 DIAGNOSIS — Z79899 Other long term (current) drug therapy: Secondary | ICD-10-CM | POA: Diagnosis not present

## 2016-02-28 DIAGNOSIS — Z96641 Presence of right artificial hip joint: Secondary | ICD-10-CM | POA: Diagnosis present

## 2016-02-28 DIAGNOSIS — F1419 Cocaine abuse with unspecified cocaine-induced disorder: Secondary | ICD-10-CM | POA: Diagnosis present

## 2016-02-28 DIAGNOSIS — R45851 Suicidal ideations: Secondary | ICD-10-CM | POA: Diagnosis present

## 2016-02-28 DIAGNOSIS — F332 Major depressive disorder, recurrent severe without psychotic features: Principal | ICD-10-CM | POA: Diagnosis present

## 2016-02-28 DIAGNOSIS — Z8249 Family history of ischemic heart disease and other diseases of the circulatory system: Secondary | ICD-10-CM

## 2016-02-28 DIAGNOSIS — Z9889 Other specified postprocedural states: Secondary | ICD-10-CM | POA: Diagnosis not present

## 2016-02-28 MED ORDER — MAGNESIUM HYDROXIDE 400 MG/5ML PO SUSP: 30.0000 mL | Freq: Every day | ORAL | Status: DC | PRN

## 2016-02-28 MED ORDER — TRAZODONE HCL 50 MG PO TABS
50.0000 mg | ORAL_TABLET | Freq: Every evening | ORAL | Status: DC | PRN
  Administered 2016-02-28: 50 mg via ORAL
  Filled 2016-02-28: qty 1

## 2016-02-28 MED ORDER — ALUM & MAG HYDROXIDE-SIMETH 200-200-20 MG/5ML PO SUSP: 30.0000 mL | ORAL | Status: DC | PRN

## 2016-02-28 MED ORDER — ACETAMINOPHEN 325 MG PO TABS
650.0000 mg | ORAL_TABLET | Freq: Four times a day (QID) | ORAL | Status: DC | PRN
  Administered 2016-02-28 – 2016-03-02 (×3): 650 mg via ORAL
  Filled 2016-02-28 (×3): qty 2

## 2016-02-28 MED ORDER — HYDROXYZINE HCL 25 MG PO TABS
25.0000 mg | ORAL_TABLET | Freq: Four times a day (QID) | ORAL | Status: DC | PRN
  Administered 2016-03-01: 25 mg via ORAL
  Filled 2016-02-28: qty 1

## 2016-02-28 NOTE — Progress Notes (Signed)
Report received from admitting RN.  Introduced self to pt.  Pt denies SI/HI, denies hallucinations, denies pain.  He verbally contracts for safety.  Pt is safe on the unit and he reports he will inform staff of needs and concerns.  Will continue to monitor and assess.

## 2016-02-28 NOTE — Progress Notes (Signed)
Patient attended wrap-up group and said that her day was a 4.  Something positive that happened today was she woke up.
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
## 2016-02-28 NOTE — Progress Notes (Signed)
D:  Per pt self inventory pt reports sleeping fair, appetite good, energy level low, ability to pay attention good, rates depression at a 5 out of 10, hopelessness at a 5 out of 10, anxiety at a 0 out of 10, denies SI/HI/AVH, goal today: "to get myself straight, I think being here will help", sad/depressed during interaction.     A:  Emotional support provided, Encouraged pt to continue with treatment plan and attend all group activities, q15 min checks maintained for safety.  R:  Pt is receptive, going to groups, pleasant and cooperative with staff and other patients on the unit.

## 2016-02-28 NOTE — [facility] Group Notes (Signed)
[facility] LCSW Group Therapy  02/28/2016 12:58 PM  Type of Therapy:  Group Therapy  Participation Level:  Did Not Attend-pt invited. Chose to rest in room.   Modes of Intervention:  Discussion, Education, Exploration, Problem-solving, Rapport Building, Socialization and Support  Summary of Progress/Problems: Feelings around Relapse. Group members discussed the meaning of relapse and shared personal stories of relapse, how it affected them and others, and how they perceived themselves during this time. Group members were encouraged to identify triggers, warning signs and coping skills used when facing the possibility of relapse. Social supports were discussed and explored in detail. Post Acute Withdrawal Syndrome (handout provided) was introduced and examined. Pt's were encouraged to ask questions, talk about key points associated with PAWS, and process this information in terms of relapse prevention.   Thomas [name] N Smart LCSW 02/28/2016, 12:58 PM  

## 2016-02-28 NOTE — Progress Notes (Signed)
Recreation Therapy Notes  Date: 02/28/16 Time: 0930 Location: 300 Hall Group Room  Group Topic: Stress Management  Goal Area(s) Addresses:  Patient will verbalize importance of using healthy stress management.  Patient will identify positive emotions associated with healthy stress management.   Intervention: Calm App  Activity :  Forgiveness Meditation.  LRT introduced the stress management technique of meditation.  LRT played a meditation on forgiveness to allow patients to engage in the meditation.  Patients were to follow along to with the meditation to engage in the technique.  Education:  Stress Management, Discharge Planning.   Education Outcome: Acknowledges edcuation/In group clarification offered/Needs additional education  Clinical Observations/Feedback: Pt did not attend group.   Caroll RancherMarjette Chay Mazzoni, LRT/CTRS         Caroll RancherLindsay, Breyanna Valera A 02/28/2016 11:49 AM

## 2016-02-28 NOTE — Progress Notes (Signed)
Patient ID: Thomas [name], male   DOB: 01/26/1986, 30 y.o.   MRN: [id number] PER STATE REGULATIONS 482.30  THIS CHART WAS REVIEWED FOR MEDICAL NECESSITY WITH RESPECT TO THE PATIENT'S ADMISSION/DURATION OF STAY.  NEXT REVIEW DATE: 03/03/16  Loura HaltBARBARA Cara Aguino, RN, BSN CASE MANAGER

## 2016-02-28 NOTE — [facility] Suicide Risk Assessment (Signed)
[facility]   Nursing information obtained from:    Demographic factors:    Current Mental Status:    Loss Factors:    Historical Factors:    Risk Reduction Factors:     Total Time spent with patient: 30 minutes Principal Problem: Cocaine abuse with cocaine-induced disorder [facility](HCC) Diagnosis:   Patient Active Problem List   Diagnosis Date Noted  . MDD (major depressive disorder), recurrent severe, without psychosis ([facility]) [F33.2] 02/27/2016  . 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 Right leg pain [M79.604] 08/29/2013  . LBP (low back pain) [M54.5]   . Insomnia [G47.00]    Subjective Data: A she denies current suicidal or homicidal ideation, plan or intent.  Continued Clinical Symptoms:  Alcohol Use Disorder Identification Test Final Score (AUDIT): 0 The "Alcohol Use Disorders Identification Test", Guidelines for Use in Primary Care, Second Edition.  World Science writerHealth Organization [facility](WHO). Score between 0-7:  no or low risk or alcohol related problems. Score between 8-15:  moderate risk of alcohol related problems. Score between 16-19:  high risk of alcohol related problems. Score 20 or above:  warrants further diagnostic evaluation for alcohol dependence and treatment.   CLINICAL FACTORS:   Alcohol/Substance Abuse/Dependencies   Musculoskeletal: Strength & Muscle Tone: within normal limits Gait & Station: normal Patient leans: N/A  Psychiatric Specialty Exam: Physical Exam  ROS  Blood pressure 138/81, pulse 81, temperature 98.2 F (36.8 C), temperature source Oral, resp. rate 16, height 5\' 10"  (1.778 m), weight 114.3 kg (252 lb), SpO2 100 %.Body mass index is 36.16 kg/m.    General Appearance: Casual  Eye Contact:  Good  Speech:  Clear and Coherent  Volume:  Normal  Mood:  Anxious  Affect:  Congruent  Thought Process:  Coherent  Orientation:  Negative  Thought Content:  Negative  Suicidal Thoughts:  No   Homicidal Thoughts:  No  Memory:  Negative  Judgement:  Fair  Insight:  Fair  Psychomotor Activity:  Normal  Concentration:  Concentration: Good  Recall:  Good  Fund of Knowledge:  Good  Language:  Good  Akathisia:  No  Handed:  Right  AIMS (if indicated):     Assets:  Resilience  ADL's:  Intact  Cognition:  WNL  Sleep:  Number of Hours: 3.5    COGNITIVE FEATURES THAT CONTRIBUTE TO RISK:  None    SUICIDE RISK:   Mild:  Suicidal ideation of limited frequency, intensity, duration, and specificity.  There are no identifiable plans, no associated intent, mild dysphoria and related symptoms, good self-control (both objective and subjective assessment), few other risk factors, and identifiable protective factors, including available and accessible social support.   PLAN OF CARE: see PAA  I certify that inpatient services furnished can reasonably be expected to improve the patient's condition.  Acquanetta SitElizabeth Woods Rio Taber, MD 02/28/2016, 12:15 PM

## 2016-02-28 NOTE — Plan of Care (Signed)
Problem: Safety: Goal: Periods of time without injury will increase Outcome: Progressing Pt. remains a low fall risk, denies SI/HI/AVH at this time, Q 15 checks in effect.    
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
## 2016-02-28 NOTE — Progress Notes (Signed)
D: Pt. is up and visible in the milieu, seen talking on the phone in the dayroom. Denies having any SI/HI/AVH/Pain at this time. Pt. presents with an animated affect and mood. Pt. states " I just want to go to sleep early". Pt. remains cooperative & pleasant on the unit with no issues noted.   A: Encouragement and support given. No Meds. ordered at this time. PRN Trazodone requested and given. Will re-eval as necessary.   R: 1:1 interaction in private. Safety maintained with Q 15 checks. Continues to follow treatment plan and will monitor closely. No other questions/concerns at this time.

## 2016-02-28 NOTE — Tx Team (Signed)
Interdisciplinary Treatment and Diagnostic Plan Update  02/28/2016 Time of Session: 9;30AM Thomas [name] MRN: [id number]  Principal Diagnosis: Cocaine abuse with cocaine-induced disorder [facility](HCC)  Secondary Diagnoses: Principal Problem:   Cocaine abuse with cocaine-induced disorder (HCC)   Current Medications:  Current Facility-Administered Medications  Medication Dose Route Frequency Provider Last Rate Last Dose  . acetaminophen (TYLENOL) tablet 650 mg  650 mg Oral Q6H PRN Laveda AbbeLaurie Britton Parks, NP      . alum & mag hydroxide-simeth (MAALOX/MYLANTA) 200-200-20 MG/5ML suspension 30 mL  30 mL Oral Q4H PRN Laveda AbbeLaurie Britton Parks, NP      . hydrOXYzine (ATARAX/VISTARIL) tablet 25 mg  25 mg Oral Q6H PRN Laveda AbbeLaurie Britton Parks, NP      . magnesium hydroxide (MILK OF MAGNESIA) suspension 30 mL  30 mL Oral Daily PRN Laveda AbbeLaurie Britton Parks, NP      . traZODone (DESYREL) tablet 50 mg  50 mg Oral QHS PRN Laveda AbbeLaurie Britton Parks, NP       PTA Medications: Prescriptions Prior to Admission  Medication Sig Dispense Refill Last Dose  . pregabalin (LYRICA) 100 MG capsule Take 1 capsule (100 mg total) by mouth 3 (three) times daily. (Patient not taking: Reported on 02/27/2016) 90 capsule 5 Not Taking at Unknown time    Patient Stressors: Financial difficulties Substance abuse  Patient Strengths: Capable of independent living Barrister's clerkCommunication skills Motivation for treatment/growth Supportive family/friends  Treatment Modalities: Medication Management, Group therapy, Case management,  1 to 1 session with clinician, Psychoeducation, Recreational therapy.   Physician Treatment Plan for Primary Diagnosis: Cocaine abuse with cocaine-induced disorder (HCC) Long Term Goal(s): Improvement in symptoms so as ready for discharge Improvement in symptoms so as ready for discharge   Short Term Goals: Ability to demonstrate self-control will improve Ability to identify changes in lifestyle to reduce recurrence of  condition will improve Ability to identify triggers associated with substance abuse/mental health issues will improve  Medication Management: Evaluate patient's response, side effects, and tolerance of medication regimen.  Therapeutic Interventions: 1 to 1 sessions, Unit Group sessions and Medication administration.  Evaluation of Outcomes: Progressing  Physician Treatment Plan for Secondary Diagnosis: Principal Problem:   Cocaine abuse with cocaine-induced disorder (HCC)  Long Term Goal(s): Improvement in symptoms so as ready for discharge Improvement in symptoms so as ready for discharge   Short Term Goals: Ability to demonstrate self-control will improve Ability to identify changes in lifestyle to reduce recurrence of condition will improve Ability to identify triggers associated with substance abuse/mental health issues will improve     Medication Management: Evaluate patient's response, side effects, and tolerance of medication regimen.  Therapeutic Interventions: 1 to 1 sessions, Unit Group sessions and Medication administration.  Evaluation of Outcomes: Progressing   RN Treatment Plan for Primary Diagnosis: Cocaine abuse with cocaine-induced disorder (HCC) Long Term Goal(s): Knowledge of disease and therapeutic regimen to maintain health will improve  Short Term Goals: Ability to remain free from injury will improve, Ability to disclose and discuss suicidal ideas and Ability to identify and develop effective coping behaviors will improve  Medication Management: RN will administer medications as ordered by provider, will assess and evaluate patient's response and provide education to patient for prescribed medication. RN will report any adverse and/or side effects to prescribing provider.  Therapeutic Interventions: 1 on 1 counseling sessions, Psychoeducation, Medication administration, Evaluate responses to treatment, Monitor vital signs and CBGs as ordered, Perform/monitor  CIWA, COWS, AIMS and Fall Risk screenings as ordered, Perform wound care treatments as  ordered.  Evaluation of Outcomes: Progressing   LCSW Treatment Plan for Primary Diagnosis: Cocaine abuse with cocaine-induced disorder (HCC) Long Term Goal(s): Safe transition to appropriate next level of care at discharge, Engage patient in therapeutic group addressing interpersonal concerns.  Short Term Goals: Engage patient in aftercare planning with referrals and resources, Facilitate patient progression through stages of change regarding substance use diagnoses and concerns and Identify triggers associated with mental health/substance abuse issues  Therapeutic Interventions: Assess for all discharge needs, 1 to 1 time with Social worker, Explore available resources and support systems, Assess for adequacy in community support network, Educate family and significant other(s) on suicide prevention, Complete Psychosocial Assessment, Interpersonal group therapy.  Evaluation of Outcomes: Progressing   Progress in Treatment: Attending groups: No. New to unit. Continuing to assess. Participating in groups: No. Taking medication as prescribed: Yes. Toleration medication: Yes. Family/Significant other contact made: No, will contact:  family member if patient consents Patient understands diagnosis: Yes. Discussing patient identified problems/goals with staff: Yes. Medical problems stabilized or resolved: Yes. Denies suicidal/homicidal ideation: Yes. Issues/concerns per patient self-inventory: No. Other: n/a  New problem(s) identified: No, Describe:  n/a  New Short Term/Long Term Goal(s): medication stabilization; detox; development of comprehensive mental wellness/sobriety plan.   Discharge Plan or Barriers:  CSW assessing for appropriate referrals. Pt denies any prior inpatient or outpatient substance abuse treatment or mental health care.   Reason for Continuation of Hospitalization:  Depression Medication stabilization Withdrawal symptoms  Estimated Length of Stay: 3-5 days   Attendees: Patient: 02/28/2016 1:00 PM  Physician: Dr. Mckinley Jewelates MD 02/28/2016 1:00 PM  Nursing: Sharalyn InkPenny, Patty RN 02/28/2016 1:00 PM  RN Care Manager: Onnie BoerJennifer Clark CM 02/28/2016 1:00 PM  Social Worker: Chartered loss adjusterHeather Smart, LCSW 02/28/2016 1:00 PM  Recreational Therapist:  02/28/2016 1:00 PM  Other: Gray BernhardtMay Augustin NP; Armandina StammerAgnes Nwoko NP 02/28/2016 1:00 PM  Other:  02/28/2016 1:00 PM  Other: 02/28/2016 1:00 PM    Scribe for Treatment Team: Ledell PeoplesHeather N Smart, LCSW 02/28/2016 1:00 PM

## 2016-02-28 NOTE — Tx Team (Signed)
Initial Treatment Plan 02/28/2016 1:28 AM Toma Copierhomas Seth Retz FAO:[id number]    PATIENT STRESSORS: Financial difficulties Substance abuse   PATIENT STRENGTHS: Capable of independent living Communication skills Motivation for treatment/growth Supportive family/friends   PATIENT IDENTIFIED PROBLEMS: MDD  "I get sad and cry a lot"  DI  "I tried to cut myself with a knife but didn't do it"  Substance Abuse  "I use crake and smoke weed, I need help to stop"           DISCHARGE CRITERIA:  Ability to meet basic life and health needs Adequate post-discharge living arrangements Improved stabilization in mood, thinking, and/or behavior Medical problems require only outpatient monitoring Motivation to continue treatment in a less acute level of care Safe-care adequate arrangements made  PRELIMINARY DISCHARGE PLAN: Attend 12-step recovery group Outpatient therapy Return to previous living arrangement  PATIENT/FAMILY INVOLVEMENT: This treatment plan has been presented to and reviewed with the patient, Thomas [name]. The patient has been given the opportunity to ask questions and make suggestions.  Sylvan CheeseSteven M Monicia Tse, RN 02/28/2016, 1:28 AM

## 2016-02-29 DIAGNOSIS — Z9889 Other specified postprocedural states: Secondary | ICD-10-CM

## 2016-02-29 MED ORDER — DOXEPIN HCL 25 MG PO CAPS
25.0000 mg | ORAL_CAPSULE | Freq: Every day | ORAL | Status: DC
  Administered 2016-02-29 – 2016-03-01 (×2): 25 mg via ORAL
  Filled 2016-02-29 (×4): qty 1

## 2016-02-29 MED ORDER — CITALOPRAM HYDROBROMIDE 10 MG PO TABS
10.0000 mg | ORAL_TABLET | Freq: Every day | ORAL | Status: DC
  Administered 2016-02-29 – 2016-03-02 (×3): 10 mg via ORAL
  Filled 2016-02-29 (×7): qty 1

## 2016-02-29 MED ORDER — MELOXICAM 7.5 MG PO TABS
7.5000 mg | ORAL_TABLET | Freq: Every day | ORAL | Status: DC
  Administered 2016-02-29 – 2016-03-03 (×4): 7.5 mg via ORAL
  Filled 2016-02-29 (×8): qty 1

## 2016-02-29 MED ORDER — LIDOCAINE 5 % EX PTCH
1.0000 | MEDICATED_PATCH | CUTANEOUS | Status: DC
Start: 2016-02-29 — End: 2016-03-03
  Administered 2016-02-29 – 2016-03-02 (×3): 1 via TRANSDERMAL
  Filled 2016-02-29 (×6): qty 1

## 2016-02-29 NOTE — BHH Group Notes (Signed)
Adult Psychoeducational Group Note  Date:  02/29/2016 Time:  0915am  Group Topic/Focus:  Goals Group:   The focus of this group is to help patients establish daily goals to achieve during treatment and discuss how the patient can incorporate goal setting into their daily lives to aide in recovery.   Participation Level:  Did Not Attend   Lauris Poagndrea B Clary Meeker 02/29/2016, 10:16 AM

## 2016-02-29 NOTE — BHH Group Notes (Signed)
Adult Psychoeducational Group Note  Date:  02/29/2016 Time:  2:19 PM  Group Topic/Focus:  Positive thinking--follow up from am group  Participation Level:  Did Not Attend  Lauris Poagndrea B Khristine Verno 02/29/2016, 2:19 PM

## 2016-02-29 NOTE — Progress Notes (Signed)
D: Pt. is up and visible in the milieu, finished with shower and was going to the dayroom. Denies having any SI/HI/AVH/Pain at this time. Pt. presents with an animated affect and mood. Pt. states " I just want to go to sleep early". Pt. does appear to be anxious at times. Pt. remains cooperative & pleasant on the unit with no issues noted.   A: Encouragement and support given. New meds. started and education given to Pt. Meds. ordered and given.   R: 1:1 interaction in private. Safety maintained with Q 15 checks. Continues to follow treatment plan and will monitor closely. No other questions/concerns at this time

## 2016-02-29 NOTE — Plan of Care (Signed)
Problem: Activity: Goal: Interest or engagement in activities will improve Outcome: Progressing Pt. is slowly improving in s/s, is going out more into the dayroom and interacting with others.

## 2016-02-29 NOTE — Progress Notes (Signed)
D:  Per pt self inventory pt reports sleeping good, appetite fair, energy level normal, ability to pay attention good, rates depression at a 2 out of 10, hopelessness at a 4 out of 10, anxiety at a 0 out of 10, denies SI/HI/AVH, goal today: "to get better, talk about things", depressed/flat during interaction.     A:  Emotional support provided, Encouraged pt to continue with treatment plan and attend all group activities, q15 min checks maintained for safety.  R:  Pt has not been going to groups, otherwise pleasant and cooperative with staff and other patients on the unit.

## 2016-02-29 NOTE — BHH Group Notes (Signed)
Adult Therapy Group Note  Date:  02/29/2016  Time:  10:00-11:00AM  Group Topic/Focus: Unhealthy vs Healthy Coping Techniques  Building Self Esteem:   The focus of this group was to assist patients in becoming aware of the differences between healthy and unhealthy coping techniques, as well as how to determine which type they are using.  The benefits of learning healthier coping were discussed.   An exercise was used to identify patients' fears and to demonstrate that in general, their fears are commonly experienced.  This helped patients to provide support to each other and to determine that, in fact, they are not alone.   The whiteboard was utilized to record the discussion, and many patients copied this down.  Participation Level:  Minimal  Participation Quality:  Attentive  Affect:  Not Congruent  Cognitive:  Not assessed  Insight: Improving  Engagement in Group:  only present 10 minutes  Modes of Intervention:  Exercise, Discussion and Support  Additional Comments:  The patient was only present for the last 10 minutes of group, listened attentively but did not participate.  Ambrose MantleMareida Grossman-Orr, LCSW 02/29/2016   12:35pm
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
## 2016-03-01 NOTE — Progress Notes (Signed)
Adult Psychoeducational Group Note  Date:  02/29/2016 Time:  09:32pm  Group Topic/Focus:  Wrap-Up Group:   The focus of this group is to help patients review their daily goal of treatment and discuss progress on daily workbooks.   Participation Level:  Active  Participation Quality:  Appropriate  Affect:  Appropriate  Cognitive:  Appropriate  Insight: Appropriate  Engagement in Group:  Engaged  Modes of Intervention:  Discussion  Additional Comments:  Patient attended wrap-up group and he said that his day was a 6.  His goal for today was to start the healing process and it is now in process.  His coping skills were coloring, socializing, and watching tv.  Theresia Pree W Orpah Hausner 02/29/2016, 9:32pm

## 2016-03-01 NOTE — Progress Notes (Signed)
D.  Pt pleasant on approach, denies complaints at this time.  Pt's wife came up and collected his belongings from locker with his permission except his cell phone which he did not want her to have.  Pt was positive for evening AA group, observed interacting appropriately with peers on the unit.  Pt denies SI/HI/hallucinations at this time.  A.  Support and encouragement offered, medication given as ordered  R.  Pt remains safe on the unit, will continue to monitor.
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
## 2016-03-01 NOTE — Progress Notes (Signed)
Patient denies SI, HI and AVH.  Patient reports feeling better then when he first came in.  Patient was noted to be in the day room watching tv.  Patient had no incidents of behavioral dyscontrol.   Assess patient for safety, offer medications as prescribed, engage patient in 1:1 staff talks.   Continue to monitor as planned.

## 2016-03-01 NOTE — Progress Notes (Signed)
Patient attended AA group meeting.  

## 2016-03-01 NOTE — [facility] Group Notes (Signed)
[facility] Group Notes: (Clinical Social Work)   03/01/2016      Type of Therapy:  Group Therapy   Participation Level:  Did Not Attend despite MHT prompting   Casady Voshell Grossman-Orr, LCSW 03/01/2016, 12:08 PM     

## 2016-03-01 NOTE — Plan of Care (Signed)
Problem: Activity: Goal: Interest or engagement in activities will improve Outcome: Progressing Pt did attend evening AA group   

## 2016-03-02 MED ORDER — HYDROXYZINE HCL 50 MG PO TABS
50.0000 mg | ORAL_TABLET | Freq: Every evening | ORAL | Status: DC | PRN
  Administered 2016-03-02: 50 mg via ORAL
  Filled 2016-03-02 (×7): qty 1

## 2016-03-02 NOTE — Progress Notes (Signed)
[facility]  03/02/2016 12:11 PM  Patient Active Problem List   Diagnosis Date Noted  . MDD (major depressive disorder), recurrent severe, without psychosis (HCC) 02/27/2016  . Cocaine abuse with cocaine-induced disorder (HCC) 02/27/2016  . Right leg pain 08/29/2013  . LBP (low back pain)   . Insomnia     Diagnosis: Cocaine use disorder  Subjective: Patient denies any acute complaints denies any current suicidal or homicidal ideation, plan or intent  Objective: Well-developed well-nourished man in no apparent distress pleasant and appropriate. Mood is all right affect is euthymic thought processes linear and goal-directed thought content no current suicidal or homicidal ideation, plan or intent alert and oriented 3 IQ appears average range insight and judgment are fair         Current Facility-Administered Medications (Analgesics):  .  acetaminophen (TYLENOL) tablet 650 mg .  meloxicam (MOBIC) tablet 7.5 mg     Current Facility-Administered Medications (Other):  .  alum & mag hydroxide-simeth (MAALOX/MYLANTA) 200-200-20 MG/5ML suspension 30 mL .  hydrOXYzine (ATARAX/VISTARIL) tablet 25 mg .  lidocaine (LIDODERM) 5 % 1 patch .  magnesium hydroxide (MILK OF MAGNESIA) suspension 30 mL  No current outpatient prescriptions on file.  Vital Signs:Blood pressure 133/82, pulse 74, temperature 97.7 F (36.5 C), temperature source Oral, resp. rate 18, height 5\' 10"  (1.778 m), weight 114.3 kg (252 lb), SpO2 100 %.    Lab Results: No results found for this or any previous visit (from the past 48 hour(s)).  Physical Findings: AIMS: Facial and Oral Movements Muscles of Facial Expression: None, normal Lips and Perioral Area: None, normal Jaw: None, normal Tongue: None, normal,Extremity Movements Upper (arms, wrists, hands, fingers): None, normal Lower (legs, knees, ankles, toes): None, normal, Trunk Movements Neck, shoulders, hips: None, normal, Overall  Severity Severity of abnormal movements (highest score from questions above): None, normal Incapacitation due to abnormal movements: None, normal Patient's awareness of abnormal movements (rate only patient's report): No Awareness, Dental Status Current problems with teeth and/or dentures?: No Does patient usually wear dentures?: No  CIWA:    COWS:  COWS Total Score: 2   Assessment/Plan: Patient was started on Celexa over the weekend and apparently is listed to expire today and I will let it expire. patient was also ordered Sinequan which I do not use for sleep and this will be discontinued. We will assess patient for need for any long-term treatment for mood disorders or need for sleeping agents and adjust treatment as needed. Patient appears to have long-standing dependent traits and does seem comfortable here in this institutional environment however his participation is somewhat variable area patient was reminded that this is a short-term stay and we need to proceed in the next day or 2 to arrange for his further treatment with planned discharge in the next day or so.  Acquanetta SitElizabeth Woods Oates, MD 03/02/2016, 12:11 PM

## 2016-03-02 NOTE — Progress Notes (Signed)
D:  Per pt self inventory pt reports sleeping good, appetite good, energy level low, ability to pay attention good, rates depression at a 6 out of 10, hopelessness at a 3 out of 10, anxiety at a 2 out of 10, denies SI/HI/AVH, goal today: "working on a plan to get better.", depressed during interaction.     A:  Emotional support provided, Encouraged pt to continue with treatment plan and attend all group activities, q15 min checks maintained for safety.  R:  Pt is not very vested in treatment, only going to some groups--needs continued encouragement to participate, otherwise pleasant and cooperative with staff and other patients on the unit.

## 2016-03-02 NOTE — [facility] Group Notes (Signed)
  [facility] LCSW Group Therapy  03/02/2016 2:27 PM  Type of Therapy:  Group Therapy  Participation Level:  Did Not Attend-pt invited. Chose to remain in bed.   Summary of Progress/Problems: Today's Topic: Overcoming Obstacles. Patients identified one short term goal and potential obstacles in reaching this goal. Patients processed barriers involved in overcoming these obstacles. Patients identified steps necessary for overcoming these obstacles and explored motivation (internal and external) for facing these difficulties head on.   Vendetta Pittinger N Smart LCSW 03/02/2016, 2:27 PM

## 2016-03-02 NOTE — Progress Notes (Signed)
Recreation Therapy Notes  Date: 03/02/16 Time: 0930 Location: [address] Dayroom  Group Topic: Stress Management  Goal Area(s) Addresses:  Patient will verbalize importance of using healthy stress management.  Patient will identify positive emotions associated with healthy stress management.   Intervention: Guided Imagery  Activity :  Depression Imagery.  LRT introduced the the stress management technique of guided imagery.  LRT read a script to allow patients to engage in the technique of guided imagery.  Patients were to follow along as LRT read script to engage in the technique.  Education:  Stress Management, Discharge Planning.   Education Outcome: Acknowledges edcuation/In group clarification offered/Needs additional education  Clinical Observations/Feedback: Pt did not attend group.    Caroll RancherMarjette Egbert Seidel, LRT/CTRS         Caroll RancherLindsay, Thadeus Gandolfi A 03/02/2016 12:27 PM

## 2016-03-02 NOTE — [facility] Group Notes (Signed)
Thomas [name] was invited to group.  Did not attend.    Spiritual care group on grief and loss facilitated by chaplain Burnis KingfisherMatthew Taevion Sikora   Group opened with brief discussion and psycho-social ed around grief and loss in relationships and in relation to self - identifying life patterns, circumstances, changes that cause losses. Established group norm of speaking from own life experience. Group goal of establishing open and affirming space for members to share loss and experience with grief, normalize grief experience and provide psycho social education and grief support.

## 2016-03-02 NOTE — BHH Suicide Risk Assessment (Signed)
[facility]   Principal Problem: Cocaine abuse with cocaine-induced disorder [facility](HCC) Discharge Diagnoses:  Patient Active Problem List   Diagnosis Date Noted  . MDD (major depressive disorder), recurrent severe, without psychosis (HCC) [F33.2] 02/27/2016  . 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 Right leg pain [M79.604] 08/29/2013  . LBP (low back pain) [M54.5]   . Insomnia [G47.00]     Total Time spent with patient: 15 minutes  Musculoskeletal: Strength & Muscle Tone: within normal limits Gait & Station: normal Patient leans: N/A  Psychiatric Specialty Exam: ROS  Blood pressure 133/82, pulse 74, temperature 97.7 F (36.5 C), temperature source Oral, resp. rate 18, height 5\' 10"  (1.778 m), weight 114.3 kg (252 lb), SpO2 100 %.Body mass index is 36.16 kg/m.   General Appearance: Casual  Eye Contact::  Good  Speech:  Clear and Coherent  Volume:  Normal  Mood:  Euthymic  Affect:  Congruent  Thought Process:  Coherent  Orientation:  Negative  Thought Content:  Negative  Suicidal Thoughts:  No  Homicidal Thoughts:  No  Memory:  Negative  Judgement:  Fair  Insight:  Fair  Psychomotor Activity:  Normal  Concentration:  Good  Recall:  Good  Fund of Knowledge:Good  Language: Good  Akathisia:  No  Handed:  Right  AIMS (if indicated):     Assets:  Resilience    Cognition: WNL  ADL's:  Intact     Mental Status Per Nursing Assessment::   On Admission:     Demographic Factors:  Male  Loss Factors: NA  Historical Factors: NA  Risk Reduction Factors:   Living with another person, especially a relative and Positive social support  Continued Clinical Symptoms:  Alcohol/Substance Abuse/Dependencies  Cognitive Features That Contribute To Risk:  None    Suicide Risk:  Mild:  Suicidal ideation of limited frequency, intensity, duration, and specificity.  There are no identifiable plans, no associated  intent, mild dysphoria and related symptoms, good self-control (both objective and subjective assessment), few other risk factors, and identifiable protective factors, including available and accessible social support.  Follow-up Information    [facility] Follow up on 03/04/2016.   Why:  Appt for hospital follow-up and assessment at 12:15PM ont his date. Please bring picture ID, social security card, medicare card, and proof of household income if you have it. Thank you.  Contact information: [address] [place] [address] [phone]        [facility] Follow up.   Why:  Referral faxed: 02/28/16. If you are still interested in pursuing this treatment program, please call Shayla in admissions daily at 9:00AM to check status of referral and waitlist. Thank you.  Contact information: [address] [place], [place] [address] Phone: [phone] Fax: [phone]          Plan Of Care/Follow-up recommendations:  Other:  Patient denies current suicidal or homicidal ideation, plan or intent. He should follow-up with any needed psychiatric or substance use treatment after discharge.  Acquanetta SitElizabeth Woods Dollene Mallery, MD 03/02/2016, 3:41 PM
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
## 2016-03-03 MED ORDER — HYDROXYZINE HCL 25 MG PO TABS: 25.0000 mg | ORAL_TABLET | Freq: Four times a day (QID) | ORAL | 0 refills | Status: DC | PRN

## 2016-03-03 MED ORDER — MELOXICAM 7.5 MG PO TABS: 7.5000 mg | ORAL_TABLET | Freq: Every day | ORAL | 0 refills | Status: DC

## 2016-03-03 NOTE — Discharge Summary (Signed)
Physician Discharge Summary Note  Patient:  Thomas [name] is an 30 y.o., male MRN:  [id number] DOB:  Aug 16, 1985 Patient phone:  [phone] (home)  Patient address:   [address] [place] [address],  Total Time spent with patient: 30 minutes  Date of Admission:  02/28/2016 Date of Discharge: 03/03/2016  Reason for Admission:  suicidal ideations  Principal Problem: Cocaine abuse with cocaine-induced disorder [facility](HCC) Discharge Diagnoses: Patient Active Problem List   Diagnosis Date Noted  . MDD (major depressive disorder), recurrent severe, without psychosis (HCC) [F33.2] 02/27/2016  . 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 Right leg pain [M79.604] 08/29/2013  . LBP (low back pain) [M54.5]   . Insomnia [G47.00]     Past Psychiatric History: see HPI  Past Medical History:  Past Medical History:  Diagnosis Date  . Insomnia   . LBP (low back pain)     Past Surgical History:  Procedure Laterality Date  . HAND SURGERY Right   . TOTAL HIP ARTHROPLASTY Right    Family History:  Family History  Problem Relation Age of Onset  . Sleep apnea Mother   . Diabetes Mother   . Heart disease Maternal Grandmother   . Heart disease Maternal Grandfather    Family Psychiatric  History: see HPI Social History:  History  Alcohol Use No    Comment: Quit 2014     History  Drug Use  . Types: Cocaine    Social History   Social History  . Marital status: Unknown    Spouse name: N/A  . Number of children: N/A  . Years of education: 10th   Occupational History  .  Unemployed    disabled   Social History Main Topics  . Smoking status: Never Smoker  . Smokeless tobacco: Never Used  . Alcohol use No     Comment: Quit 2014  . Drug use:     Types: Cocaine  . Sexual activity: Yes    Birth control/ protection: Condom   Other Topics Concern  . None   Social History Narrative   Patient lives at home with her Marchia MeiersFiance Wilkie Aye(Kristy)     Disabled.   Caffeine eight cans of diet mountain dews.   Education 10 th grade.    Hospital Course:  Thomas [name] is an 30 y.o. male that reported suicidal ideation with a plan to cut his wrist.  Patient reported that he was addicted to cocaine.  Patient reported increased depression with the inability to stop using cocaine.    Thomas [name] was admitted for Cocaine abuse with cocaine-induced disorder [facility](HCC) and crisis management.  Patient was treated with medications with their indications listed below in detail under Medication List.  Medical problems were identified and treated as needed.  Home medications were restarted as appropriate.  Improvement was monitored by observation and Thomas Paganinihomas Seth [name] daily report of symptom reduction.  Emotional and mental status was monitored by daily self inventory reports completed by Thomas Paganinihomas Seth [name] and clinical staff.  Patient reported continued improvement, denied any new concerns.  Patient had been compliant on medications and denied side effects.  Support and encouragement was provided.    Thomas [name] was evaluated by the treatment team for stability and plans for continued recovery upon discharge.  Patient was offered further treatment options upon discharge including Residential, Intensive Outpatient and Outpatient treatment. Patient will follow up with agency listed below for medication management and counseling.  Encouraged patient  to maintain satisfactory support network and home environment.  Advised to adhere to medication compliance and outpatient treatment follow up.  Prescriptions provided.       Thomas [name] motivation was an integral factor for scheduling further treatment.  Employment, transportation, bed availability, health status, family support, and any pending legal issues were also considered during patient's hospital stay.  Upon completion of this admission the patient was both mentally and medically stable for  discharge denying suicidal/homicidal ideation, auditory/visual/tactile hallucinations, delusional thoughts and paranoia.      Physical Findings: AIMS: Facial and Oral Movements Muscles of Facial Expression: None, normal Lips and Perioral Area: None, normal Jaw: None, normal Tongue: None, normal,Extremity Movements Upper (arms, wrists, hands, fingers): None, normal Lower (legs, knees, ankles, toes): None, normal, Trunk Movements Neck, shoulders, hips: None, normal, Overall Severity Severity of abnormal movements (highest score from questions above): None, normal Incapacitation due to abnormal movements: None, normal Patient's awareness of abnormal movements (rate only patient's report): No Awareness, Dental Status Current problems with teeth and/or dentures?: No Does patient usually wear dentures?: No  CIWA:    COWS:  COWS Total Score: 2  Musculoskeletal: [name] & Muscle Tone: within normal limits Gait & Station: normal Patient leans: N/A  Psychiatric Specialty Exam: Physical Exam  Nursing note and vitals reviewed. Psychiatric: He has a normal mood and affect. Judgment and thought content normal. He is aggressive. Thought content is not paranoid. He expresses no homicidal and no suicidal ideation.    Review of Systems  Constitutional: Negative.   HENT: Negative.   Eyes: Negative.   Respiratory: Negative.   Cardiovascular: Negative.   Gastrointestinal: Negative.   Genitourinary: Negative.   Musculoskeletal: Negative.   Skin: Negative.   Neurological: Negative.   Endo/Heme/Allergies: Negative.   Psychiatric/Behavioral: Negative.     Blood pressure (!) 118/97, pulse 62, temperature 97.5 F (36.4 C), temperature source Oral, resp. rate 18, height 5\' 10"  (1.778 m), weight 114.3 kg (252 lb), SpO2 100 %.Body mass index is 36.16 kg/m.    Have you used any form of tobacco in the last 30 days? (Cigarettes, Smokeless Tobacco, Cigars, and/or Pipes): No  Has this patient used any  form of tobacco in the last 30 days? (Cigarettes, Smokeless Tobacco, Cigars, and/or Pipes) Yes, N/A  Blood Alcohol level:  Lab Results  Component Value Date   ETH <5 02/27/2016    Metabolic Disorder Labs:  No results found for: HGBA1C, MPG No results found for: PROLACTIN No results found for: CHOL, TRIG, HDL, CHOLHDL, VLDL, LDLCALC  See Psychiatric Specialty Exam and Suicide Risk Assessment completed by Attending Physician prior to discharge.  Discharge destination:  Home  Is patient on multiple antipsychotic therapies at discharge:  No   Has Patient had three or more failed trials of antipsychotic monotherapy by history:  No  Recommended Plan for Multiple Antipsychotic Therapies: NA     Medication List    STOP taking these medications   pregabalin 100 MG capsule Commonly known as:  LYRICA     TAKE these medications     Indication  hydrOXYzine 25 MG tablet Commonly known as:  ATARAX/VISTARIL Take 1 tablet (25 mg total) by mouth every 6 (six) hours as needed for anxiety.  Indication:  Anxiety Neurosis   meloxicam 7.5 MG tablet Commonly known as:  MOBIC Take 1 tablet (7.5 mg total) by mouth daily. Start taking on:  03/04/2016  Indication:  Joint Damage causing Pain and Loss of Function  Follow-up Information    [facility] Follow up on 03/04/2016.   Why:  Appt for hospital follow-up and assessment at 12:15PM ont his date. Please bring picture ID, social security card, medicare card, and proof of household income if you have it. Thank you.  Contact information: [address] [place] [address] [phone]        [facility] Follow up.   Why:  Referral faxed: 02/28/16. If you are still interested in pursuing this treatment program, please call Shayla in admissions daily at 9:00AM to check status of referral and waitlist. Thank you.  Contact information: [address] [place], [place] [address] Phone: [phone] Fax: [phone]           Follow-up recommendations:  Activity:  as tol Diet:  as tol  Comments:  1.  Take all your medications as prescribed.   2.  Report any adverse side effects to outpatient provider. 3.  Patient instructed to not use alcohol or illegal drugs while on prescription medicines. 4.  In the event of worsening symptoms, instructed patient to call 911, the crisis hotline or go to nearest emergency room for evaluation of symptoms.  Signed: Lindwood QuaSheila May [place], NP [facility] 03/03/2016, 11:03 AM

## 2016-03-03 NOTE — Progress Notes (Signed)
  [facility] :  Will you be returning to the same living situation after discharge:  Yes,  home At discharge, do you have transportation home?: Yes,  gf picking him up Do you have the ability to pay for your medications: Yes,  Medicare  Release of information consent forms completed and submitted to medical records by CSW.  Patient to Follow up at: Follow-up Information    [facility] Follow up on 03/04/2016.   Why:  Appt for hospital follow-up and assessment at 12:15PM ont his date. Please bring picture ID, social security card, medicare card, and proof of household income if you have it. Thank you.  Contact information: [address] [place] [address] [phone]        [facility] Follow up.   Why:  Referral faxed: 02/28/16. If you are still interested in pursuing this treatment program, please call Shayla in admissions daily at 9:00AM to check status of referral and waitlist. Thank you.  Contact information: [address] [place], [place] [address] Phone: [phone] Fax: [phone]          Next level of care provider has access to [facility] Link:no  Safety Planning and Suicide Prevention discussed: Yes,  SPE completed with pt's gf.  Have you used any form of tobacco in the last 30 days? (Cigarettes, Smokeless Tobacco, Cigars, and/or Pipes): No  Has patient been referred to the Quitline?: N/A patient is not a smoker  Patient has been referred for addiction treatment: Yes  Ledell PeoplesHeather N Smart LCSW 03/03/2016, 9:18 AM

## 2016-03-03 NOTE — Progress Notes (Signed)
Patient was discharged per order. AVS, medications, scripts, transition summary, and suicide safety plan were all reviewed with patient. Pt was given an opportunity to ask questions and verbalized understanding of all discharge paperwork. Belongings were returned, and patient signed for receipt. Patient verbalized readiness for discharge and appeared in no acute distress when escorted to lobby.  

## 2016-03-03 NOTE — Progress Notes (Signed)
D: Emmanual denied SI, HI, and AVH this a.m. He complained of chronic lower back pain, for which he received meloxicam with some relief. He rated his feelings of depression, hopelessness, and anxiety all 3/10 on his self inventory. He also reported good sleep, appetite, and concentration, but rated his energy level as low. He didn't go to a.m goals group but did attend afternoon group. He voiced readiness for discharge.   A: Meds given as ordered. Q15 safety checks maintained. Support/encouragement offered.  R: Pt remains free from harm and continues with treatment. Will continue to monitor for needs/safety.

## 2016-03-03 NOTE — Progress Notes (Signed)
D: Pt was in the day room upon initial approach.  Pt presents with appropriate affect and depressed mood.  He reports his goal is "to get better" and he reports he feels as though he has accomplished this goal.  Pt denies SI/HI, denies hallucinations, reports chronic back pain of 9/10.  Pt has been visible in milieu interacting with peers and staff appropriately.   A: Introduced self to pt.  Actively listened to pt and offered support and encouragement. Medications administered per order.  PRN medication administered for pain. R: Pt is safe on the unit.  Pt is compliant with medications.  Pt verbally contracts for safety.  Will continue to monitor and assess.

## 2016-03-03 NOTE — BHH Group Notes (Signed)
BHH Group Notes:  (Nursing/MHT/Case Management/Adjunct)  Date:  03/03/2016  Time:  0900  Type of Therapy:  Nurse Education  Participation Level:  Did Not Attend  Summary of Progress/Problems: Pt was invited but did not attend.   Maurine SimmeringShugart, Jazlyn Tippens M 03/03/2016, 10:39 AM

## 2016-03-03 NOTE — Tx Team (Signed)
Interdisciplinary Treatment and Diagnostic Plan Update  03/03/2016 Time of Session: 9:30AM Thomas [name] MRN: [id number]  Principal Diagnosis: Cocaine abuse with cocaine-induced disorder [facility])  Secondary Diagnoses: Principal Problem:   Cocaine abuse with cocaine-induced disorder ([place]) Active Problems:   MDD (major depressive disorder), recurrent severe, without psychosis ([place])   Current Medications:  Current Facility-Administered Medications  Medication Dose Route Frequency Provider Last Rate Last Dose  . acetaminophen (TYLENOL) tablet 650 mg  650 mg Oral Q6H PRN Ethelene Hal, NP   650 mg at 03/02/16 2111  . alum & mag hydroxide-simeth (MAALOX/MYLANTA) 200-200-20 MG/5ML suspension 30 mL  30 mL Oral Q4H PRN Ethelene Hal, NP      . hydrOXYzine (ATARAX/VISTARIL) tablet 25 mg  25 mg Oral Q6H PRN Ethelene Hal, NP   25 mg at 03/01/16 2108  . hydrOXYzine (ATARAX/VISTARIL) tablet 50 mg  50 mg Oral QHS,MR X 1 Laverle Hobby, PA-C   50 mg at 03/02/16 2111  . lidocaine (LIDODERM) 5 % 1 patch  1 patch Transdermal Q24H Benjamine Mola, FNP   1 patch at 03/02/16 1645  . magnesium hydroxide (MILK OF MAGNESIA) suspension 30 mL  30 mL Oral Daily PRN Ethelene Hal, NP      . meloxicam [facility]) tablet 7.5 mg  7.5 mg Oral Daily Benjamine Mola, FNP   7.5 mg at 03/03/16 0800   PTA Medications: Prescriptions Prior to Admission  Medication Sig Dispense Refill Last Dose  . pregabalin (LYRICA) 100 MG capsule Take 1 capsule (100 mg total) by mouth 3 (three) times daily. (Patient not taking: Reported on 02/27/2016) 90 capsule 5 Not Taking at Unknown time    Patient Stressors: Financial difficulties Substance abuse  Patient Strengths: Capable of independent living Agricultural engineer for treatment/growth Supportive family/friends  Treatment Modalities: Medication Management, Group therapy, Case management,  1 to 1 session with clinician, Psychoeducation,  Recreational therapy.   Physician Treatment Plan for Primary Diagnosis: Cocaine abuse with cocaine-induced disorder ([place]) Long Term Goal(s): Improvement in symptoms so as ready for discharge Improvement in symptoms so as ready for discharge   Short Term Goals: Ability to demonstrate self-control will improve Ability to identify changes in lifestyle to reduce recurrence of condition will improve Ability to identify triggers associated with substance abuse/mental health issues will improve  Medication Management: Evaluate patient's response, side effects, and tolerance of medication regimen.  Therapeutic Interventions: 1 to 1 sessions, Unit Group sessions and Medication administration.  Evaluation of Outcomes:  Met  Physician Treatment Plan for Secondary Diagnosis: Principal Problem:   Cocaine abuse with cocaine-induced disorder ([place]) Active Problems:   MDD (major depressive disorder), recurrent severe, without psychosis ([place])  Long Term Goal(s): Improvement in symptoms so as ready for discharge Improvement in symptoms so as ready for discharge   Short Term Goals: Ability to demonstrate self-control will improve Ability to identify changes in lifestyle to reduce recurrence of condition will improve Ability to identify triggers associated with substance abuse/mental health issues will improve     Medication Management: Evaluate patient's response, side effects, and tolerance of medication regimen.  Therapeutic Interventions: 1 to 1 sessions, Unit Group sessions and Medication administration.  Evaluation of Outcomes: Met   RN Treatment Plan for Primary Diagnosis: Cocaine abuse with cocaine-induced disorder ([place]) Long Term Goal(s): Knowledge of disease and therapeutic regimen to maintain health will improve  Short Term Goals: Ability to remain free from injury will improve, Ability to disclose and discuss suicidal ideas and  Ability to identify and develop effective coping behaviors  will improve  Medication Management: RN will administer medications as ordered by provider, will assess and evaluate patient's response and provide education to patient for prescribed medication. RN will report any adverse and/or side effects to prescribing provider.  Therapeutic Interventions: 1 on 1 counseling sessions, Psychoeducation, Medication administration, Evaluate responses to treatment, Monitor vital signs and CBGs as ordered, Perform/monitor CIWA, COWS, AIMS and Fall Risk screenings as ordered, Perform wound care treatments as ordered.  Evaluation of Outcomes: Met   LCSW Treatment Plan for Primary Diagnosis: Cocaine abuse with cocaine-induced disorder ([place]) Long Term Goal(s): Safe transition to appropriate next level of care at discharge, Engage patient in therapeutic group addressing interpersonal concerns.  Short Term Goals: Engage patient in aftercare planning with referrals and resources, Facilitate patient progression through stages of change regarding substance use diagnoses and concerns and Identify triggers associated with mental health/substance abuse issues  Therapeutic Interventions: Assess for all discharge needs, 1 to 1 time with Social worker, Explore available resources and support systems, Assess for adequacy in community support network, Educate family and significant other(s) on suicide prevention, Complete Psychosocial Assessment, Interpersonal group therapy.  Evaluation of Outcomes: Met   Progress in Treatment: Attending groups: Yes Participating in groups Yes Taking medication as prescribed: Yes. Toleration medication: Yes. Family/Significant other contact made: SPE completed with girlfriend.  Patient understands diagnosis: Yes. Discussing patient identified problems/goals with staff: Yes. Medical problems stabilized or resolved: Yes. Denies suicidal/homicidal ideation: Yes. Issues/concerns per patient self-inventory: No. Other: n/a  New problem(s)  identified: No, Describe:  n/a  New Short Term/Long Term Goal(s): medication stabilization; detox; development of comprehensive mental wellness/sobriety plan.   Discharge Plan or Barriers:  Pt is on [facility] waitlist. Plans to return home with gf until [facility] bed becomes available and has follow-up appt at [facility] in [place] for Wed 12/13. Pt's gf will pick him up after lunch today 03/03/2016   Reason for Continuation of Hospitalization: none  Estimated Length of Stay: d/c today   Attendees: Patient: 03/03/2016 9:15 AM  Physician: Dr. Sharolyn Douglas MD 03/03/2016 9:15 AM  Nursing: Chauncy Lean RN 03/03/2016 9:15 AM  RN Care Manager: Lars Pinks CM 03/03/2016 9:15 AM  Social Worker: Press photographer, LCSW 03/03/2016 9:15 AM  Recreational Therapist:  03/03/2016 9:15 AM  Other: Samuel Jester NP; Lindell Spar NP 03/03/2016 9:15 AM  Other:  03/03/2016 9:15 AM  Other: 03/03/2016 9:15 AM    Scribe for Treatment Team: [place], LCSW 03/03/2016 9:15 AM

## 2016-03-03 NOTE — Plan of Care (Signed)
Problem: Medication: Goal: Compliance with prescribed medication regimen will improve Outcome: Progressing Pt has been compliant with scheduled medication tonight.    

## 2017-02-07 ENCOUNTER — Ambulatory Visit (HOSPITAL_COMMUNITY)
Admission: AD | Admit: 2017-02-07 | Discharge: 2017-02-07 | Disposition: A | Discharge status: Home / Self Care | Payer: Medicare Other | Attending: Psychiatry | Admitting: Psychiatry

## 2017-02-07 DIAGNOSIS — Z1330 Encounter for screening examination for mental health and behavioral disorders, unspecified: Secondary | ICD-10-CM | POA: Insufficient documentation
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
## 2017-02-07 NOTE — H&P (Signed)
Behavioral Health Medical Screening Exam  Thomas [name] is an 31 y.o. male.  Total Time spent with patient: 15 minutes  Psychiatric Specialty Exam: Physical Exam  Constitutional: He is oriented to person, place, and time. He appears well-developed and well-nourished. No distress.  HENT:  Head: Normocephalic and atraumatic.  Right Ear: External ear normal.  Left Ear: External ear normal.  Eyes: Conjunctivae are normal. Right eye exhibits no discharge. Left eye exhibits no discharge. No scleral icterus.  Cardiovascular: Normal rate, regular rhythm and normal heart sounds.  Respiratory: Effort normal and breath sounds normal. No respiratory distress.  Musculoskeletal: Normal range of motion.  Neurological: He is alert and oriented to person, place, and time.  Skin: Skin is warm and dry. He is not diaphoretic.  Psychiatric: His mood appears anxious. He is not actively hallucinating. Thought content is not paranoid and not delusional. He does not express impulsivity or inappropriate judgment. He exhibits a depressed mood. He expresses no homicidal and no suicidal ideation.    Review of Systems  Psychiatric/Behavioral: Positive for depression, hallucinations and substance abuse. Negative for suicidal ideas. The patient is nervous/anxious. The patient does not have insomnia.   All other systems reviewed and are negative.   Blood pressure 119/73, pulse 72, temperature 98.6 F (37 C), resp. rate 16, SpO2 98 %.There is no height or weight on file to calculate BMI.  General Appearance: Casual and Fairly Groomed  Eye Contact:  Good  Speech:  Clear and Coherent and Normal Rate  Volume:  Normal  Mood:  Anxious and Depressed  Affect:  Congruent and Depressed  Thought Process:  Coherent and Goal Directed  Orientation:  Full (Time, Place, and Person)  Thought Content:  Logical  Suicidal Thoughts:  No  Homicidal Thoughts:  No  Memory:  Immediate;   Good Recent;   Good Remote;   Good   Judgement:  Intact  Insight:  Good  Psychomotor Activity:  Normal  Concentration: Concentration: Good and Attention Span: Good  Recall:  Good  Fund of Knowledge:Good  Language: Good  Akathisia:  No  Handed:  Right  AIMS (if indicated):     Assets:  Communication Skills Desire for Improvement Financial Resources/Insurance Housing Leisure Time Physical Health  Sleep:       Musculoskeletal: Strength & Muscle Tone: within normal limits Gait & Station: normal  Blood pressure 119/73, pulse 72, temperature 98.6 F (37 C), resp. rate 16, SpO2 98 %.  Recommendations:  Based on my evaluation the patient does not appear to have an emergency medical condition.Requesting detox/substance abuse treatment only. Does not meet inpatient criteria. TTS to provide with outpatient resources and substance abuse treatment information.  Jackelyn PolingJason A  Cohick, NP 02/07/2017, 11:13 PM

## 2017-02-23 DIAGNOSIS — J45909 Unspecified asthma, uncomplicated: Secondary | ICD-10-CM | POA: Diagnosis not present

## 2017-02-24 ENCOUNTER — Other Ambulatory Visit: Payer: Self-pay

## 2017-02-24 ENCOUNTER — Emergency Department (HOSPITAL_COMMUNITY)
Admission: EM | Admit: 2017-02-24 | Discharge: 2017-02-24 | Disposition: A | Discharge status: Home / Self Care | Payer: Medicare Other | Attending: Emergency Medicine | Admitting: Emergency Medicine

## 2017-02-24 DIAGNOSIS — R443 Hallucinations, unspecified: Secondary | ICD-10-CM

## 2017-02-24 DIAGNOSIS — R45851 Suicidal ideations: Secondary | ICD-10-CM | POA: Diagnosis not present

## 2017-02-24 DIAGNOSIS — F23 Brief psychotic disorder: Secondary | ICD-10-CM | POA: Diagnosis not present

## 2017-02-24 DIAGNOSIS — F1910 Other psychoactive substance abuse, uncomplicated: Secondary | ICD-10-CM | POA: Insufficient documentation

## 2017-02-24 DIAGNOSIS — Z79899 Other long term (current) drug therapy: Secondary | ICD-10-CM | POA: Diagnosis not present

## 2017-02-24 NOTE — ED Triage Notes (Signed)
Per EMS:  Pt is a meth user. Pt reports last using meth yesterday around 10pm. Pt is having increasing hallucinations that are visual and auditory. Pt has been calm with EMS. Unremarkable EKG with EMS. Neighbors gave pt 4 of Narcan.  No heroin, alcohol or opioids per pt.

## 2017-02-24 NOTE — ED Notes (Signed)
ED Provider at bedside. 

## 2017-02-24 NOTE — ED Provider Notes (Signed)
[facility]-EMERGENCY DEPT Provider Note  CSN: [id number] Arrival date & time: 02/24/17 1134  Chief Complaint(s) Hallucinations and Medical Clearance  HPI Thomas [name] is a 31 y.o. male with a history of polysubstance abuse presents to the emergency department with hallucinations.  Patient reports that he has not had any sleep in the past 3-4 days, stating that he has been on a meth binge.  Reports that he does this intermittently for the past 2 years.  Reports that he likes the way the math makes his face feel numb.  Reports that the hallucinations are weird, stating that today after going to the doctor's office he noted a sticker on the inside of his car that was not there before.  Also noted that his can of energy drink was half full when he left it full.  Patient reports that he is having suicidal ideations stating that he wants to get clean and if he does not he is going to end up dead.  Denies any active plan.  Denies any homicidal ideation.  HPI  Past Medical History Past Medical History:  Diagnosis Date  . Insomnia   . LBP (low back pain)    Patient Active Problem List   Diagnosis Date Noted  . MDD (major depressive disorder), recurrent severe, without psychosis (HCC) 02/27/2016  . Cocaine abuse with cocaine-induced disorder (HCC) 02/27/2016  . Right leg pain 08/29/2013  . LBP (low back pain)   . Insomnia    Home Medication(s) Prior to Admission medications   Medication Sig Start Date End Date Taking? Authorizing Provider  hydrOXYzine (ATARAX/VISTARIL) 25 MG tablet Take 1 tablet (25 mg total) by mouth every 6 (six) hours as needed for anxiety. 03/03/16   Adonis BrookAgustin, Sheila, NP  meloxicam (MOBIC) 7.5 MG tablet Take 1 tablet (7.5 mg total) by mouth daily. 03/04/16   Adonis BrookAgustin, Sheila, NP                                                                                                                                    Past Surgical History Past Surgical  History:  Procedure Laterality Date  . HAND SURGERY Right   . TOTAL HIP ARTHROPLASTY Right    Family History Family History  Problem Relation Age of Onset  . Sleep apnea Mother   . Diabetes Mother   . Heart disease Maternal Grandmother   . Heart disease Maternal Grandfather     Social History Social History   Tobacco Use  . Smoking status: Never Smoker  . Smokeless tobacco: Never Used  Substance Use Topics  . Alcohol use: No    Comment: Quit 2014  . Drug use: Yes    Types: Cocaine   Allergies Patient has no known allergies.  Review of Systems Review of Systems All other systems are reviewed and are negative for acute change except as noted in the HPI  Physical Exam Vital Signs  I have reviewed the triage vital signs BP 132/89 (BP Location: Right Arm)   Pulse 89   Temp 98.2 F (36.8 C) (Oral)   Resp 18   SpO2 100%   Physical Exam  Constitutional: He is oriented to person, place, and time. He appears well-developed and well-nourished. No distress.  HENT:  Head: Normocephalic and atraumatic.  Nose: Nose normal.  Eyes: Conjunctivae and EOM are normal. Pupils are equal, round, and reactive to light. Right eye exhibits no discharge. Left eye exhibits no discharge. No scleral icterus.  Neck: Normal range of motion. Neck supple.  Cardiovascular: Normal rate and regular rhythm. Exam reveals no gallop and no friction rub.  No murmur heard. Pulmonary/Chest: Effort normal and breath sounds normal. No stridor. No respiratory distress. He has no rales.  Abdominal: Soft. He exhibits no distension. There is no tenderness.  Musculoskeletal: He exhibits no edema or tenderness.  Neurological: He is alert and oriented to person, place, and time.  Skin: Skin is warm and dry. No rash noted. He is not diaphoretic. No erythema.  Psychiatric: He has a normal mood and affect.  Vitals reviewed.   ED Results and Treatments Labs (all labs ordered are listed, but only abnormal  results are displayed) Labs Reviewed - No data to display                                                                                                                       EKG  EKG Interpretation  Date/Time:    Ventricular Rate:    PR Interval:    QRS Duration:   QT Interval:    QTC Calculation:   R Axis:     Text Interpretation:        Radiology No results found. Pertinent labs & imaging results that were available during my care of the patient were reviewed by me and considered in my medical decision making (see chart for details).  Medications Ordered in ED Medications - No data to display                                                                                                                                  Procedures Procedures  (including critical care time)  Medical Decision Making / ED Course I have reviewed the nursing notes for this encounter and the patient's prior records (if available in EHR or on provided paperwork).    Presentation is  likely secondary from drug abuse psychosis versus lack of sleep.  Patient is having passive suicidal ideations with no active plans.  Feel he is safe for discharge with outpatient psychiatric management.  He was given resources for [employer].  The patient is safe for discharge with strict return precautions.   Final Clinical Impression(s) / ED Diagnoses Final diagnoses:  Polysubstance abuse (HCC)  Hallucinations  Suicidal ideation   Disposition: Discharge  Condition: Good  I have discussed the results, Dx and Tx plan with the patient who expressed understanding and agree(s) with the plan. Discharge instructions discussed at great length. The patient was given strict return precautions who verbalized understanding of the instructions. No further questions at time of discharge.    ED Discharge Orders    None       Follow Up: Eunice Blase, PA-C [address] [place]  [address] [phone]  Schedule an appointment as soon as possible for a visit  As needed  [address] [place] [address] [phone]  Go to  for assistance with substance abuse rehabilitation      This chart was dictated using voice recognition software.  Despite best efforts to proofread,  errors can occur which can change the documentation meaning.   Nira Conn, MD 02/24/17 [phone]

## 2017-08-30 ENCOUNTER — Emergency Department (HOSPITAL_COMMUNITY)
Admission: EM | Admit: 2017-08-30 | Discharge: 2017-08-31 | Disposition: A | Discharge status: Other Acute Inpatient Hospital | Payer: Medicare Other | Attending: Emergency Medicine | Admitting: Emergency Medicine

## 2017-08-30 ENCOUNTER — Ambulatory Visit (HOSPITAL_COMMUNITY)
Admission: RE | Admit: 2017-08-30 | Discharge: 2017-08-30 | Disposition: A | Discharge status: Home / Self Care | Payer: Medicare Other | Source: Home / Self Care | Attending: Psychiatry | Admitting: Psychiatry

## 2017-08-30 ENCOUNTER — Encounter (HOSPITAL_COMMUNITY): Payer: Self-pay

## 2017-08-30 DIAGNOSIS — Z96641 Presence of right artificial hip joint: Secondary | ICD-10-CM | POA: Insufficient documentation

## 2017-08-30 DIAGNOSIS — F1220 Cannabis dependence, uncomplicated: Secondary | ICD-10-CM | POA: Insufficient documentation

## 2017-08-30 DIAGNOSIS — F909 Attention-deficit hyperactivity disorder, unspecified type: Secondary | ICD-10-CM

## 2017-08-30 DIAGNOSIS — F1520 Other stimulant dependence, uncomplicated: Secondary | ICD-10-CM | POA: Insufficient documentation

## 2017-08-30 DIAGNOSIS — Z833 Family history of diabetes mellitus: Secondary | ICD-10-CM | POA: Insufficient documentation

## 2017-08-30 DIAGNOSIS — F411 Generalized anxiety disorder: Secondary | ICD-10-CM

## 2017-08-30 DIAGNOSIS — R45851 Suicidal ideations: Secondary | ICD-10-CM | POA: Insufficient documentation

## 2017-08-30 DIAGNOSIS — F319 Bipolar disorder, unspecified: Secondary | ICD-10-CM

## 2017-08-30 DIAGNOSIS — F1910 Other psychoactive substance abuse, uncomplicated: Secondary | ICD-10-CM | POA: Insufficient documentation
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
## 2017-08-30 NOTE — ED Notes (Signed)
Bed: WTR8 Expected date:  Expected time:  Means of arrival:  Comments: 
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
## 2017-08-30 NOTE — H&P (Signed)
Behavioral Health Medical Screening Exam  Thomas [name] is an 32 y.o. male.  Total Time spent with patient: 20 minutes  Psychiatric Specialty Exam: Physical Exam  Constitutional: He is oriented to person, place, and time. He appears well-developed and well-nourished. No distress.  HENT:  Head: Normocephalic and atraumatic.  Right Ear: External ear normal.  Left Ear: External ear normal.  Eyes: Conjunctivae are normal. Right eye exhibits no discharge. Left eye exhibits no discharge. No scleral icterus.  Cardiovascular: Normal rate.  Respiratory: Effort normal. No respiratory distress.  Musculoskeletal: Normal range of motion.  Neurological: He is alert and oriented to person, place, and time.  Skin: Skin is warm and dry. He is not diaphoretic.  Psychiatric: His speech is normal. His mood appears anxious. He is not withdrawn and not actively hallucinating. Thought content is paranoid. Thought content is not delusional. Cognition and memory are normal. He expresses impulsivity and inappropriate judgment. He exhibits a depressed mood. He expresses homicidal and suicidal ideation. He expresses suicidal plans. He expresses no homicidal plans.    Review of Systems  Constitutional: Negative for chills, fever and weight loss.  Psychiatric/Behavioral: Positive for depression, hallucinations, substance abuse and suicidal ideas. Negative for memory loss. The patient is nervous/anxious and has insomnia.   All other systems reviewed and are negative.   Blood pressure 132/76, pulse 92, temperature 98.6 F (37 C), resp. rate 16, SpO2 99 %.There is no height or weight on file to calculate BMI.  General Appearance: Casual and Fairly Groomed  Eye Contact:  Good  Speech:  Clear and Coherent and Normal Rate  Volume:  Normal  Mood:  Anxious, Depressed, Hopeless, Irritable and Worthless  Affect:  Congruent, Depressed and Tearful  Thought Process:  Coherent, Goal Directed and Descriptions of  Associations: Intact  Orientation:  Full (Time, Place, and Person)  Thought Content:  Logical and Hallucinations: Visual  Suicidal Thoughts:  Yes.  with intent/plan  Homicidal Thoughts:  Yes.  without intent/plan  Memory:  Immediate;   Good Recent;   Good  Judgement:  Impaired  Insight:  Lacking  Psychomotor Activity:  Normal  Concentration: Concentration: Fair and Attention Span: Fair  Recall:  Good  Fund of Knowledge:Good  Language: Good  Akathisia:  No  Handed:  Right  AIMS (if indicated):     Assets:  Communication Skills Desire for Improvement Financial Resources/Insurance Housing Intimacy Leisure Time Physical Health Transportation  Sleep:       Musculoskeletal: Strength & Muscle Tone: within normal limits Gait & Station: normal   Blood pressure 132/76, pulse 92, temperature 98.6 F (37 C), resp. rate 16, SpO2 99 %.  Recommendations:  Based on my evaluation the patient does not appear to have an emergency medical condition.  Recommend psychiatric Inpatient admission when medically cleared.  Jackelyn PolingJason A Durk Carmen, NP 08/30/2017, 10:59 PM

## 2017-08-31 ENCOUNTER — Inpatient Hospital Stay (HOSPITAL_COMMUNITY)
Admission: AD | Admit: 2017-08-31 | Discharge: 2017-09-05 | DRG: 885 | Disposition: A | Discharge status: Home / Self Care | Payer: Medicare Other | Source: Intra-hospital | Attending: Psychiatry | Admitting: Psychiatry

## 2017-08-31 ENCOUNTER — Encounter (HOSPITAL_COMMUNITY): Payer: Self-pay

## 2017-08-31 ENCOUNTER — Other Ambulatory Visit: Payer: Self-pay

## 2017-08-31 DIAGNOSIS — Z818 Family history of other mental and behavioral disorders: Secondary | ICD-10-CM

## 2017-08-31 DIAGNOSIS — G4700 Insomnia, unspecified: Secondary | ICD-10-CM | POA: Diagnosis present

## 2017-08-31 DIAGNOSIS — Z87828 Personal history of other (healed) physical injury and trauma: Secondary | ICD-10-CM

## 2017-08-31 DIAGNOSIS — F15259 Other stimulant dependence with stimulant-induced psychotic disorder, unspecified: Secondary | ICD-10-CM | POA: Diagnosis present

## 2017-08-31 DIAGNOSIS — R441 Visual hallucinations: Secondary | ICD-10-CM | POA: Diagnosis present

## 2017-08-31 DIAGNOSIS — F12159 Cannabis abuse with psychotic disorder, unspecified: Secondary | ICD-10-CM | POA: Diagnosis present

## 2017-08-31 DIAGNOSIS — L299 Pruritus, unspecified: Secondary | ICD-10-CM | POA: Diagnosis present

## 2017-08-31 DIAGNOSIS — F333 Major depressive disorder, recurrent, severe with psychotic symptoms: Principal | ICD-10-CM | POA: Diagnosis present

## 2017-08-31 DIAGNOSIS — R45851 Suicidal ideations: Secondary | ICD-10-CM | POA: Diagnosis present

## 2017-08-31 DIAGNOSIS — F419 Anxiety disorder, unspecified: Secondary | ICD-10-CM | POA: Diagnosis not present

## 2017-08-31 DIAGNOSIS — Z813 Family history of other psychoactive substance abuse and dependence: Secondary | ICD-10-CM

## 2017-08-31 DIAGNOSIS — Z630 Problems in relationship with spouse or partner: Secondary | ICD-10-CM

## 2017-08-31 DIAGNOSIS — R454 Irritability and anger: Secondary | ICD-10-CM

## 2017-08-31 DIAGNOSIS — Z96641 Presence of right artificial hip joint: Secondary | ICD-10-CM | POA: Diagnosis present

## 2017-08-31 DIAGNOSIS — F332 Major depressive disorder, recurrent severe without psychotic features: Secondary | ICD-10-CM

## 2017-08-31 DIAGNOSIS — F1410 Cocaine abuse, uncomplicated: Secondary | ICD-10-CM | POA: Diagnosis not present

## 2017-08-31 DIAGNOSIS — F1524 Other stimulant dependence with stimulant-induced mood disorder: Secondary | ICD-10-CM

## 2017-08-31 DIAGNOSIS — Z560 Unemployment, unspecified: Secondary | ICD-10-CM | POA: Diagnosis not present

## 2017-08-31 DIAGNOSIS — F1490 Cocaine use, unspecified, uncomplicated: Secondary | ICD-10-CM | POA: Diagnosis not present

## 2017-08-31 DIAGNOSIS — F1210 Cannabis abuse, uncomplicated: Secondary | ICD-10-CM

## 2017-08-31 DIAGNOSIS — F14159 Cocaine abuse with cocaine-induced psychotic disorder, unspecified: Secondary | ICD-10-CM | POA: Diagnosis present

## 2017-08-31 LAB — CBC
HCT: 40.4 % (ref 39.0–52.0)
Hemoglobin: 13.7 g/dL (ref 13.0–17.0)
MCH: 30.2 pg (ref 26.0–34.0)
MCHC: 33.9 g/dL (ref 30.0–36.0)
MCV: 89.2 fL (ref 78.0–100.0)
PLATELETS: 244 10*3/uL (ref 150–400)
RBC: 4.53 MIL/uL (ref 4.22–5.81)
RDW: 12.4 % (ref 11.5–15.5)
WBC: 7.5 10*3/uL (ref 4.0–10.5)

## 2017-08-31 LAB — COMPREHENSIVE METABOLIC PANEL
ALT: 23 U/L (ref 17–63)
ANION GAP: 5 (ref 5–15)
AST: 23 U/L (ref 15–41)
Albumin: 3.9 g/dL (ref 3.5–5.0)
Alkaline Phosphatase: 71 U/L (ref 38–126)
BILIRUBIN TOTAL: 0.4 mg/dL (ref 0.3–1.2)
BUN: 9 mg/dL (ref 6–20)
CO2: 32 mmol/L (ref 22–32)
Calcium: 9.4 mg/dL (ref 8.9–10.3)
Chloride: 104 mmol/L (ref 101–111)
Creatinine, Ser: 0.99 mg/dL (ref 0.61–1.24)
Glucose, Bld: 99 mg/dL (ref 65–99)
Potassium: 4.3 mmol/L (ref 3.5–5.1)
Sodium: 141 mmol/L (ref 135–145)
TOTAL PROTEIN: 7.6 g/dL (ref 6.5–8.1)

## 2017-08-31 LAB — HEMOGLOBIN A1C
Hgb A1c MFr Bld: 4.8 % (ref 4.8–5.6)
Mean Plasma Glucose: 91.06 mg/dL

## 2017-08-31 LAB — ACETAMINOPHEN LEVEL: Acetaminophen (Tylenol), Serum: <10 ug/mL — ABNORMAL LOW (ref 10–30)

## 2017-08-31 LAB — RAPID URINE DRUG SCREEN, HOSP PERFORMED
Amphetamines: POSITIVE — AB
BENZODIAZEPINES: NOT DETECTED
Barbiturates: NOT DETECTED
Cocaine: NOT DETECTED
Opiates: NOT DETECTED
Tetrahydrocannabinol: NOT DETECTED

## 2017-08-31 LAB — LIPID PANEL
CHOL/HDL RATIO: 4.2 ratio
CHOLESTEROL: 133 mg/dL (ref 0–200)
HDL: 32 mg/dL — AB (ref >40)
LDL Cholesterol: 72 mg/dL (ref 0–99)
TRIGLYCERIDES: 147 mg/dL (ref <150)
VLDL: 29 mg/dL (ref 0–40)

## 2017-08-31 LAB — SALICYLATE LEVEL

## 2017-08-31 LAB — TSH: TSH: 2.734 u[IU]/mL (ref 0.350–4.500)

## 2017-08-31 LAB — ETHANOL

## 2017-08-31 MED ORDER — ACETAMINOPHEN 325 MG PO TABS
650.0000 mg | ORAL_TABLET | Freq: Four times a day (QID) | ORAL | Status: DC | PRN
  Filled 2017-08-31: qty 2

## 2017-08-31 MED ORDER — HYDROXYZINE HCL 25 MG PO TABS
25.0000 mg | ORAL_TABLET | Freq: Four times a day (QID) | ORAL | Status: DC | PRN
  Administered 2017-09-01: 25 mg via ORAL
  Filled 2017-08-31: qty 1
  Filled 2017-08-31: qty 10

## 2017-08-31 MED ORDER — MAGNESIUM HYDROXIDE 400 MG/5ML PO SUSP: 30.0000 mL | Freq: Every day | ORAL | Status: DC | PRN

## 2017-08-31 MED ORDER — TRAZODONE HCL 50 MG PO TABS
50.0000 mg | ORAL_TABLET | Freq: Every evening | ORAL | Status: DC | PRN
  Filled 2017-08-31: qty 10

## 2017-08-31 MED ORDER — ALUM & MAG HYDROXIDE-SIMETH 200-200-20 MG/5ML PO SUSP: 30.0000 mL | ORAL | Status: DC | PRN

## 2017-08-31 MED ORDER — ARIPIPRAZOLE 5 MG PO TABS
5.0000 mg | ORAL_TABLET | Freq: Every day | ORAL | Status: DC
  Administered 2017-08-31 – 2017-09-01 (×2): 5 mg via ORAL
  Filled 2017-08-31 (×5): qty 1

## 2017-08-31 NOTE — Progress Notes (Signed)
Nursing note 7p-7a Pt admitted to unit, educated on unit routine. [id number] Denies need Pt now resting in bed with eyes closed with no signs or symptoms of pain or distress noted. Pt continues to remain safe on the unit and is observed by rounding every 15 min. RN will continue to monitor.

## 2017-08-31 NOTE — Tx Team (Signed)
Initial Treatment Plan 08/31/2017 6:17 AM Rise Paganinihomas Seth Conti ZOX:[id number]    PATIENT STRESSORS: Financial difficulties Marital or family conflict Substance abuse   PATIENT STRENGTHS: Ability for insight Average or above average intelligence Communication skills Motivation for treatment/growth   PATIENT IDENTIFIED PROBLEMS: SI  I only want to hurt myself or my wife when Im high    Substance Abuse  I do Meth.  I want to quit, I need help                 DISCHARGE CRITERIA:  Ability to meet basic life and health needs Adequate post-discharge living arrangements Improved stabilization in mood, thinking, and/or behavior Medical problems require only outpatient monitoring Motivation to continue treatment in a less acute level of care Need for constant or close observation no longer present Reduction of life-threatening or endangering symptoms to within safe limits Safe-care adequate arrangements made Verbal commitment to aftercare and medication compliance Withdrawal symptoms are absent or subacute and managed without 24-hour nursing intervention  PRELIMINARY DISCHARGE PLAN: Attend aftercare/continuing care group Outpatient therapy Participate in family therapy Return to previous living arrangement  PATIENT/FAMILY INVOLVEMENT: This treatment plan has been presented to and reviewed with the patient, Rise Paganinihomas Seth Sotomayor.  The patient has been given the opportunity to ask questions and make suggestions.  Sylvan CheeseSteven M Lavel Rieman, RN 08/31/2017, 6:17 AM

## 2017-08-31 NOTE — Progress Notes (Signed)
Patient sleeping upon initial approach. Patient did not get out of bed until about 1300. Affect flat and blunt, mood appropriate for situation. Patient does not have any complaints at the moment. Denies SI/HI/AVH. Engages minimally in conversation. Safety maintained with 15 minute checks. Will continue to monitor.
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
## 2017-08-31 NOTE — ED Notes (Signed)
[facility] called for transport. 

## 2017-08-31 NOTE — Progress Notes (Signed)
Patient ID: Thomas [name] Thomas [name], male   DOB: [birth year], 32 y.o.   MRN: [id number] PER STATE REGULATIONS 482.30  THIS CHART WAS REVIEWED FOR MEDICAL NECESSITY WITH RESPECT TO THE PATIENT'S ADMISSION/DURATION OF STAY.  NEXT REVIEW DATE:09/04/17  Loura HaltBARBARA Kristen Bushway, RN, BSN CASE MANAGER

## 2017-08-31 NOTE — BHH Suicide Risk Assessment (Signed)
[facility]   Nursing information obtained from:  Patient Demographic factors:  Male, Caucasian, Low socioeconomic status, Unemployed Current Mental Status:  Self-harm thoughts Loss Factors:  Financial problems / change in socioeconomic status Historical Factors:  Prior suicide attempts Risk Reduction Factors:  Religious beliefs about death, Living with another person, especially a relative, Positive social support  Total Time spent with patient: 45 minutes Principal Problem:  Stimulant Dependence, Stimulant Induced Mood Disorder  Diagnosis:   Patient Active Problem List   Diagnosis Date Noted  . Severe recurrent major depression with psychotic features (HCC) [F33.3] 08/31/2017  . MDD (major depressive disorder), recurrent severe, without psychosis (HCC) [F33.2] 02/27/2016  . 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 Right leg pain [M79.604] 08/29/2013  . LBP (low back pain) [M54.5]   . Insomnia [G47.00]    Subjective Data:   Continued Clinical Symptoms:    The "Alcohol Use Disorders Identification Test", Guidelines for Use in Primary Care, Second Edition.  World Science writerHealth Organization [place] [facility](WHO). Score between 0-7:  no or low risk or alcohol related problems. Score between 8-15:  moderate risk of alcohol related problems. Score between 16-19:  high risk of alcohol related problems. Score 20 or above:  warrants further diagnostic evaluation for alcohol dependence and treatment.   CLINICAL FACTORS:  32 year old male, history of stimulant ( methamphetamine ) dependence, presents due to worsening depression, passive SI, and reports history of visual hallucinations and recent increased ruminations that SO is being unfaithful. Reports he has been irritable and engaging in verbal altercations with strangers.   Psychiatric Specialty Exam: Physical Exam  ROS  Blood pressure 124/79, pulse 69, temperature 98.3 F (36.8 C), temperature  source Oral, resp. rate 18, height 5\' 10"  (1.778 m), weight 113.4 kg (250 lb), SpO2 100 %.Body mass index is 35.87 kg/m.  See admit note MSE                                                         COGNITIVE FEATURES THAT CONTRIBUTE TO RISK:  Closed-mindedness and Loss of executive function    SUICIDE RISK:   Moderate:  Frequent suicidal ideation with limited intensity, and duration, some specificity in terms of plans, no associated intent, good self-control, limited dysphoria/symptomatology, some risk factors present, and identifiable protective factors, including available and accessible social support.  PLAN OF CARE: Patient will be admitted to inpatient psychiatric unit for stabilization and safety. Will provide and encourage milieu participation. Provide medication management and maked adjustments as needed.  Will follow daily.    I certify that inpatient services furnished can reasonably be expected to improve the patient's condition.   Craige CottaFernando A Cobos, MD 08/31/2017, 11:55 AM

## 2017-08-31 NOTE — ED Notes (Signed)
[facility]

## 2017-08-31 NOTE — Progress Notes (Signed)
Per pt request, CSW faxed hospital letter with date of admission to his attorney Suzan SlickJason Goins at fax number provided by pt: [phone]. Fax and confirmation provided to pt for his records. Pt had court today[place] [facility] [place] [facility] [place] [facility].  Karol Liendo S. Alan RipperHolloway, MSW, LCSW Clinical Social Worker 08/31/2017 2:08 PM

## 2017-08-31 NOTE — BHH Suicide Risk Assessment (Signed)
B[facility] INPATIENT:  Family/Significant Other Suicide Prevention Education  Suicide Prevention Education:  Education Completed; Micah Merchison (pt's fiance) [phone] has been identified by the patient as the family member/significant other with whom the patient will be residing, and identified as the person(s) who will aid the patient in the event of a mental health crisis (suicidal ideations/suicide attempt).  With written consent from the patient, the family member/significant other has been provided the following suicide prevention education, prior to the and/or following the discharge of the patient.  The suicide prevention education provided includes the following:  Suicide risk factors  Suicide prevention and interventions  National Suicide Hotline telephone number  [facility] assessment telephone number  [facility] Emergency Assistance 911  [facility] and/or Residential Mobile Crisis Unit telephone number  Request made of family/significant other to:  Remove weapons (e.g., guns, rifles, knives), all items previously/currently identified as safety concern.    Remove drugs/medications (over-the-counter, prescriptions, illicit drugs), all items previously/currently identified as a safety concern.  The family member/significant other verbalizes understanding of the suicide prevention education information provided.  The family member/significant other agrees to remove the items of safety concern listed above.  SPE and aftercare reviewed with pt's fiance. She is supportive of pt's aftercare plan to attend [facility]/therapy and attend NA groups/find a sponsor. Pt's fiance has no concerns regarding pt returning home at discharge. Pt does not have guns/weapons in his possession at home per fiance.   Ethel RanaHeather S Claudell Wohler LCSW 08/31/2017, 1:50 PM

## 2017-08-31 NOTE — ED Notes (Signed)
[facility]

## 2017-08-31 NOTE — Plan of Care (Signed)
Pt continues to progress towards goals and d/c. RN will continue to monitor.  
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
## 2017-08-31 NOTE — Plan of Care (Signed)
Patient remained safe throughout the night.  Problem: Safety: Goal: Periods of time without injury will increase Outcome: Progressing
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
## 2017-09-01 DIAGNOSIS — L299 Pruritus, unspecified: Secondary | ICD-10-CM

## 2017-09-01 DIAGNOSIS — F419 Anxiety disorder, unspecified: Secondary | ICD-10-CM

## 2017-09-01 DIAGNOSIS — F1490 Cocaine use, unspecified, uncomplicated: Secondary | ICD-10-CM

## 2017-09-01 DIAGNOSIS — G4700 Insomnia, unspecified: Secondary | ICD-10-CM

## 2017-09-01 MED ORDER — HYDROXYZINE HCL 25 MG PO TABS
25.0000 mg | ORAL_TABLET | Freq: Four times a day (QID) | ORAL | Status: DC | PRN
  Administered 2017-09-03: 25 mg via ORAL
  Filled 2017-09-01: qty 1

## 2017-09-01 MED ORDER — ARIPIPRAZOLE 10 MG PO TABS
10.0000 mg | ORAL_TABLET | Freq: Every day | ORAL | Status: DC
  Administered 2017-09-02 – 2017-09-05 (×4): 10 mg via ORAL
  Filled 2017-09-01 (×7): qty 1

## 2017-09-01 NOTE — Progress Notes (Signed)
Recreation Therapy Notes  Date: 6.12.19 Time: 0930 Location: [address] Dayroom  Group Topic: Stress Management  Goal Area(s) Addresses:  Patient will verbalize importance of using healthy stress management.  Patient will identify positive emotions associated with healthy stress management.   Intervention: Stress Management  Activity :  Meditation.  LRT introduced the stress management technique of meditation.  LRT played a meditation on letting go of the past and focusing on right now.  Patiens were to listen and follow along as meditation played.  Education: Stress Management, Discharge Planning.   Education Outcome: Acknowledges edcuation/In group clarification offered/Needs additional education  Clinical Observations/Feedback: Pt did not attend group.     Caroll RancherMarjette Jeffrie Lofstrom, LRT/CTRS          Caroll RancherLindsay, Mickala Laton A 09/01/2017 12:31 PM

## 2017-09-01 NOTE — Progress Notes (Signed)
[facility] MD Progress Note  09/01/2017 1:06 PM Thomas [name]  MRN:  [id number] Subjective: Patient states he continues to feel depressed , he denies suicidal ideations.  States his depression is related to his GF being angry at him.  Expresses a sense of guilt about how his substance abuse has affected loved ones. He denies medication side effects. Objective : I have discussed case with treatment team and have met with patient. 32 year old male, lives with girlfriend, on disability.  Presented due to depression and suicidal ideations.  History of methamphetamine dependence. As above, presents vaguely depressed and dysphoric.  Denies suicidal ideations.  Currently denies hallucinations and does not appear internally preoccupied.  He had reported history of "seeing shadows" prior to admission, and had endorsed jealous ruminations regarding his girlfriend.  States he is no longer having these thoughts. Patient presented with skin excoriations on face and extremities felt to be related to skin picking associated with amphetamine abuse.  Lesions are currently healing and appear much better than on admission. Limited milieu participation, no agitated or disruptive behaviors. Denies medication side effects.  Principal Problem: Substance-induced mood disorder , substance-induced psychosis , stimulant dependence  Diagnosis:   Patient Active Problem List   Diagnosis Date Noted  . Severe recurrent major depression with psychotic features ([place]) [F33.3] 08/31/2017  . MDD (major depressive disorder), recurrent severe, without psychosis ([place]) [F33.2] 02/27/2016  . Cocaine abuse with cocaine-induced disorder ([place]) [F14.19] 02/27/2016  . Right leg pain [M79.604] 08/29/2013  . LBP (low back pain) [M54.5]   . Insomnia [G47.00]    Total Time spent with patient: 20 minutes  Past Psychiatric History:   Past Medical History:  Past Medical History:  Diagnosis Date  . Insomnia   . LBP (low back pain)     Past  Surgical History:  Procedure Laterality Date  . HAND SURGERY Right   . TOTAL HIP ARTHROPLASTY Right    Family History:  Family History  Problem Relation Age of Onset  . Sleep apnea Mother   . Diabetes Mother   . Heart disease Maternal Grandmother   . Heart disease Maternal Grandfather    Family Psychiatric  History:  Social History:  Social History   Substance and Sexual Activity  Alcohol Use No   Comment: Quit 2014     Social History   Substance and Sexual Activity  Drug Use Yes  . Types: Cocaine, Methamphetamines    Social History   Socioeconomic History  . Marital status: Unknown    Spouse name: Not on file  . Number of children: Not on file  . Years of education: 10th  . Highest education level: Not on file  Occupational History    Employer: UNEMPLOYED    Comment: disabled  Social Needs  . Financial resource strain: Not on file  . Food insecurity:    Worry: Not on file    Inability: Not on file  . Transportation needs:    Medical: Not on file    Non-medical: Not on file  Tobacco Use  . Smoking status: Never Smoker  . Smokeless tobacco: Never Used  Substance and Sexual Activity  . Alcohol use: No    Comment: Quit 2014  . Drug use: Yes    Types: Cocaine, Methamphetamines  . Sexual activity: Yes    Birth control/protection: Condom  Lifestyle  . Physical activity:    Days per week: Not on file    Minutes per session: Not on file  . Stress: Not on  file  Relationships  . Social connections:    Talks on phone: Not on file    Gets together: Not on file    Attends religious service: Not on file    Active member of club or organization: Not on file    Attends meetings of clubs or organizations: Not on file    Relationship status: Not on file  Other Topics Concern  . Not on file  Social History Narrative   Patient lives at home with her Domingo Mend Marita [place])    Disabled.   Caffeine eight cans of diet mountain dews.   Education 10 th grade.   Additional  Social History:    Prescriptions: NONE CURRENTLY History of alcohol / drug use?: Yes Longest period of sobriety (when/how long): 1 YEAR & 3 YEARS Negative Consequences of Use: Personal relationships, Financial Withdrawal Symptoms: Irritability, Tingling, Nausea / Vomiting, Patient aware of relationship between substance abuse and physical/medical complications, Blackouts, Weakness Name of Substance 1: METHAMPHETAMINE 1 - Age of First Use: 28 1 - Amount (size/oz): 1 TO 1 1/2 GRAMS 1 - Frequency: DAILY 1 - Duration: ONGOING 1 - Last Use / Amount: yesterday Name of Substance 2: CANNABIS 2 - Age of First Use: SINCE TEEN 2 - Amount (size/oz): VARIES 2 - Frequency: 1 X WEEK 2 - Duration: ONGOING 2 - Last Use / Amount: LAST WEEK Name of Substance 3: COCAINE (HX) 3 - Age of First Use: 20s 3 - Amount (size/oz): UNKNOWN 3 - Frequency: UNKNOWN 3 - Duration: YEARS 3 - Last Use / Amount: AUGUST 2018  Sleep: Fair  Appetite:  Fair  Current Medications: Current Facility-Administered Medications  Medication Dose Route Frequency Provider Last Rate Last Dose  . acetaminophen (TYLENOL) tablet 650 mg  650 mg Oral Q6H PRN Lindon Romp A, NP      . alum & mag hydroxide-simeth (MAALOX/MYLANTA) 200-200-20 MG/5ML suspension 30 mL  30 mL Oral Q4H PRN Lindon Romp A, NP      . ARIPiprazole (ABILIFY) tablet 5 mg  5 mg Oral Daily Cobos, Myer Peer, MD   5 mg at 09/01/17 0810  . hydrOXYzine (ATARAX/VISTARIL) tablet 25 mg  25 mg Oral Q6H PRN Lindon Romp A, NP   25 mg at 09/01/17 0810  . magnesium hydroxide (MILK OF MAGNESIA) suspension 30 mL  30 mL Oral Daily PRN Lindon Romp A, NP      . traZODone (DESYREL) tablet 50 mg  50 mg Oral QHS PRN Rozetta Nunnery, NP        Lab Results:  Results for orders placed or performed during the hospital encounter of 08/31/17 (from the past 48 hour(s))  Hemoglobin A1c     Status: None   Collection Time: 08/31/17  6:37 AM  Result Value Ref Range   Hgb A1c MFr Bld 4.8  4.8 - 5.6 %    Comment: (NOTE) Pre diabetes:          5.7%-6.4% Diabetes:              >6.4% Glycemic control for   <7.0% adults with diabetes    Mean Plasma Glucose 91.06 mg/dL    Comment: Performed at [facility], [place] [address] [place], [place] [address]  Lipid panel     Status: Abnormal   Collection Time: 08/31/17  6:37 AM  Result Value Ref Range   Cholesterol 133 0 - 200 mg/dL   Triglycerides 147 <150 mg/dL   HDL 32 (L) >40 mg/dL   Total  CHOL/HDL Ratio 4.2 RATIO   VLDL 29 0 - 40 mg/dL   LDL Cholesterol 72 0 - 99 mg/dL    Comment:        Total Cholesterol/HDL:CHD Risk Coronary Heart Disease Risk Table                     Men   Women  1/2 Average Risk   3.4   3.3  Average Risk       5.0   4.4  2 X Average Risk   9.6   7.1  3 X Average Risk  23.4   11.0        Use the calculated Patient Ratio above and the CHD Risk Table to determine the patient's CHD Risk.        ATP III CLASSIFICATION (LDL):  <100     mg/dL   Optimal  100-129  mg/dL   Near or Above                    Optimal  130-159  mg/dL   Borderline  160-189  mg/dL   High  >190     mg/dL   Very High Performed at [facility] [address], [place], [place] [address]   TSH     Status: None   Collection Time: 08/31/17  6:37 AM  Result Value Ref Range   TSH 2.734 0.350 - 4.500 uIU/mL    Comment: Performed by a 3rd Generation assay with a functional sensitivity of <=0.01 uIU/mL. Performed at [facility], [place] [address] [place], [place] [address]     Blood Alcohol level:  Lab Results  Component Value Date   ETH <10 08/31/2017   ETH <5 03/49/1791    Metabolic Disorder Labs: Lab Results  Component Value Date   HGBA1C 4.8 08/31/2017   MPG 91.06 08/31/2017   No results found for: PROLACTIN Lab Results  Component Value Date   CHOL 133 08/31/2017   TRIG 147 08/31/2017   HDL 32 (L) 08/31/2017   CHOLHDL 4.2 08/31/2017   VLDL 29 08/31/2017   LDLCALC 72  08/31/2017    Physical Findings: AIMS: Facial and Oral Movements Muscles of Facial Expression: None, normal Lips and Perioral Area: None, normal Jaw: None, normal Tongue: None, normal,Extremity Movements Upper (arms, wrists, hands, fingers): None, normal Lower (legs, knees, ankles, toes): None, normal, Trunk Movements Neck, shoulders, hips: None, normal, Overall Severity Severity of abnormal movements (highest score from questions above): None, normal Incapacitation due to abnormal movements: None, normal Patient's awareness of abnormal movements (rate only patient's report): No Awareness, Dental Status Current problems with teeth and/or dentures?: No Does patient usually wear dentures?: No  CIWA:    COWS:     Musculoskeletal: Strength & Muscle Tone: within normal limits Gait & Station: normal Patient leans: N/A  Psychiatric Specialty Exam: Physical Exam  ROS denies headache, no chest pain, no shortness of breath, some pruritus on skin excoriations, no fever, no chills  Blood pressure (!) 146/99, pulse 96, temperature 98.2 F (36.8 C), temperature source Oral, resp. rate 16, height 5' 10"  (1.778 m), weight 113.4 kg (250 lb), SpO2 100 %.Body mass index is 35.87 kg/m.  General Appearance: Fairly Groomed  Eye Contact:  Fair  Speech:  Normal Rate  Volume:  Decreased  Mood:  Reports  still feeling depressed  Affect:  Vaguely constricted, anxious  Thought Process:  Linear and Descriptions of Associations: Intact  Orientation:  Other:  Fully alert and attentive  Thought Content:  Today denies hallucinations and does not currently appear internally preoccupied, also states he is no longer concerned or preoccupied with jealous ideations on admission(have reported ruminations about significant other being unfaithful)  Suicidal Thoughts:  No today denies suicidal ideations, denies self-injurious ideations, contracts for safety on unit  Homicidal Thoughts:  No denies homicidal ideations  and specifically also denies any homicidal or violent ideations towards significant other  Memory:  Recent and remote fair  Judgement:  Fair-improving  Insight:  Fair  Psychomotor Activity:  Decreased  Concentration:  Concentration: Fair and Attention Span: Fair  Recall:  Good  Fund of Knowledge:  Good  Language:  Good  Akathisia:  Negative  Handed:  Right  AIMS (if indicated):     Assets:  Desire for Improvement Resilience  ADL's:  Intact  Cognition:  WNL  Sleep:  Number of Hours: 6.75   Assessment -32 year old male, history of methamphetamine dependence,  presented for worsening depression, passive suicidal ideations, reported visual hallucinations (shadows) and ruminations about infidelity.  Today reports ongoing depression but denies any suicidal ideations, reports hallucinations have resolved, no longer ruminative.  Skin lesions/excoriations improved compared to admission. Thus far tolerating medications well ( Abilify).    Treatment Plan Summary: Daily contact with patient to assess and evaluate symptoms and progress in treatment, Medication management, Plan Inpatient treatment and Medications as below Encourage participation in milieu to work on coping skills and symptom reduction Encourage efforts to work on sobriety and relapse prevention Treatment team working on disposition planning  Increase Abilify to 10 mg daily for mood disorder Continue trazodone 50 mg nightly PRN for insomnia Continue hydroxyzine 25 mg every 6 hours PRN for anxiety or pruritus Jenne Campus, MD 09/01/2017, 1:06 PM

## 2017-09-01 NOTE — Progress Notes (Signed)
Patient did not attend NA group meeting.  

## 2017-09-01 NOTE — Tx Team (Signed)
Interdisciplinary Treatment and Diagnostic Plan Update  09/01/2017 Time of Session: 0830AM Rise Paganinihomas Seth Chisenhall MRN: [id number]  Principal Diagnosis: MDD, recurrent, severe, with psychotic features  Secondary Diagnoses: Active Problems:   Severe recurrent major depression with psychotic features (H[facility])   Current Medications:  Current Facility-Administered Medications  Medication Dose Route Frequency Provider Last Rate Last Dose  . acetaminophen (TYLENOL) tablet 650 mg  650 mg Oral Q6H PRN Nira ConnBerry, Jason A, NP      . alum & mag hydroxide-simeth (MAALOX/MYLANTA) 200-200-20 MG/5ML suspension 30 mL  30 mL Oral Q4H PRN Nira ConnBerry, Jason A, NP      . ARIPiprazole (ABILIFY) tablet 5 mg  5 mg Oral Daily Cobos, Rockey SituFernando A, MD   5 mg at 09/01/17 0810  . hydrOXYzine (ATARAX/VISTARIL) tablet 25 mg  25 mg Oral Q6H PRN Nira ConnBerry, Jason A, NP   25 mg at 09/01/17 0810  . magnesium hydroxide (MILK OF MAGNESIA) suspension 30 mL  30 mL Oral Daily PRN Nira ConnBerry, Jason A, NP      . traZODone (DESYREL) tablet 50 mg  50 mg Oral QHS PRN Jackelyn PolingBerry, Jason A, NP       PTA Medications: Medications Prior to Admission  Medication Sig Dispense Refill Last Dose  . hydrOXYzine (ATARAX/VISTARIL) 25 MG tablet Take 1 tablet (25 mg total) by mouth every 6 (six) hours as needed for anxiety. (Patient not taking: Reported on 08/30/2017) 30 tablet 0 Not Taking at Unknown time    Patient Stressors: Financial difficulties Marital or family conflict Substance abuse  Patient Strengths: Ability for insight Average or above average Chief Operating Officerintelligence Communication skills Motivation for treatment/growth  Treatment Modalities: Medication Management, Group therapy, Case management,  1 to 1 session with clinician, Psychoeducation, Recreational therapy.   Physician Treatment Plan for Primary Diagnosis:MDD, recurrent, severe, with psychotic features  Long Term Goal(s): Improvement in symptoms so as ready for discharge Improvement in symptoms so as ready  for discharge   Short Term Goals: Ability to identify triggers associated with substance abuse/mental health issues will improve Ability to identify changes in lifestyle to reduce recurrence of condition will improve Ability to maintain clinical measurements within normal limits will improve  Medication Management: Evaluate patient's response, side effects, and tolerance of medication regimen.  Therapeutic Interventions: 1 to 1 sessions, Unit Group sessions and Medication administration.  Evaluation of Outcomes: Progressing  Physician Treatment Plan for Secondary Diagnosis: Active Problems:   Severe recurrent major depression with psychotic features (HCC)  Long Term Goal(s): Improvement in symptoms so as ready for discharge Improvement in symptoms so as ready for discharge   Short Term Goals: Ability to identify triggers associated with substance abuse/mental health issues will improve Ability to identify changes in lifestyle to reduce recurrence of condition will improve Ability to maintain clinical measurements within normal limits will improve     Medication Management: Evaluate patient's response, side effects, and tolerance of medication regimen.  Therapeutic Interventions: 1 to 1 sessions, Unit Group sessions and Medication administration.  Evaluation of Outcomes: Progressing   RN Treatment Plan for Primary Diagnosis: MDD, recurrent, severe, with psychotic features Long Term Goal(s): Knowledge of disease and therapeutic regimen to maintain health will improve  Short Term Goals: Ability to remain free from injury will improve, Ability to verbalize frustration and anger appropriately will improve, Ability to demonstrate self-control and Ability to disclose and discuss suicidal ideas  Medication Management: RN will administer medications as ordered by provider, will assess and evaluate patient's response and provide education to patient for  prescribed medication. RN will report  any adverse and/or side effects to prescribing provider.  Therapeutic Interventions: 1 on 1 counseling sessions, Psychoeducation, Medication administration, Evaluate responses to treatment, Monitor vital signs and CBGs as ordered, Perform/monitor CIWA, COWS, AIMS and Fall Risk screenings as ordered, Perform wound care treatments as ordered.  Evaluation of Outcomes: Progressing   LCSW Treatment Plan for Primary Diagnosis: MDD, recurrent, severe, with psychotic features Long Term Goal(s): Safe transition to appropriate next level of care at discharge, Engage patient in therapeutic group addressing interpersonal concerns.  Short Term Goals: Engage patient in aftercare planning with referrals and resources, Facilitate patient progression through stages of change regarding substance use diagnoses and concerns and Identify triggers associated with mental health/substance abuse issues  Therapeutic Interventions: Assess for all discharge needs, 1 to 1 time with Social worker, Explore available resources and support systems, Assess for adequacy in community support network, Educate family and significant other(s) on suicide prevention, Complete Psychosocial Assessment, Interpersonal group therapy.  Evaluation of Outcomes: Progressing   Progress in Treatment: Attending groups: Yes. Participating in groups: Yes. Taking medication as prescribed: Yes. Toleration medication: Yes. Family/Significant other contact made: SPE completed with pt's fiance.  Patient understands diagnosis: Yes. Discussing patient identified problems/goals with staff: Yes. Medical problems stabilized or resolved: Yes. Denies suicidal/homicidal ideation: Yes. Issues/concerns per patient self-inventory: No. Other: n/a  New problem(s) identified: No, Describe:  n/a  New Short Term/Long Term Goal(s): detox, medication management for mood stabilization; elimination of SI thoughts; development of comprehensive mental  wellness/sobriety plan.   Patient Goals:  "I just want to stop using meth and stop feeling so depressed."   Discharge Plan or Barriers: Pt plans to return home with his fiance and kids. Pt has follow-up appt at [facility]. MHAG pamphlet, Mobile Crisis information, and AA/NA information provided to patient for additional community support and resources.   Reason for Continuation of Hospitalization: Anxiety Depression Withdrawal symptoms  Estimated Length of Stay: Thursday, 09/02/17  Attendees: Patient: Thomas [name] 09/01/2017 8:40 AM  Physician: Dr. Altamese Lacassine MD; Dr. Jama Flavors MD 09/01/2017 8:40 AM  Nursing: Huntley Dec RN; Velna Hatchet RN 09/01/2017 8:40 AM  RN Care Manager:x 09/01/2017 8:40 AM  Social Worker: Corrie Mckusick LCSW 09/01/2017 8:40 AM  Recreational Therapist: x 09/01/2017 8:40 AM  Other: Armandina Stammer NP 09/01/2017 8:40 AM  Other:  09/01/2017 8:40 AM  Other: 09/01/2017 8:40 AM    Scribe for Treatment Team: Rona Ravens, LCSW 09/01/2017 8:40 AM

## 2017-09-01 NOTE — [facility] Group Notes (Signed)
[facility] [employer] Group Therapy 09/01/2017 1:15pm  Type of Therapy: Mental Health Association Presentation  Participation Level: Active  Participation Quality: Attentive  Affect: Appropriate  Cognitive: Oriented  Insight: Developing/Improving  Engagement in Therapy: Engaged  Modes of Intervention: Discussion, Education and Socialization  Summary of Progress/Problems: Mental Health Association (MHA) Speaker came to talk about his personal journey with mental health. The pt processed ways by which to relate to the speaker. MHA speaker provided handouts and educational information pertaining to groups and services offered by the MHA. Pt was engaged in speaker's presentation and was receptive to resources provided.    Thomas [name] S Thomas Reep, LCSW 08/31/2017 2:00PM 

## 2017-09-01 NOTE — [facility] Group Notes (Signed)
LCSW Group Therapy Note 09/01/2017 1:12 PM  Type of Therapy/Topic: Group Therapy: Emotion Regulation  Participation Level: Did Not Attend   Description of Group:  The purpose of this group is to assist patients in learning to regulate negative emotions and experience positive emotions. Patients will be guided to discuss ways in which they have been vulnerable to their negative emotions. These vulnerabilities will be juxtaposed with experiences of positive emotions or situations, and patients will be challenged to use positive emotions to combat negative ones. Special emphasis will be placed on coping with negative emotions in conflict situations, and patients will process healthy conflict resolution skills.  Therapeutic Goals: 1. Patient will identify two positive emotions or experiences to reflect on in order to balance out negative emotions 2. Patient will label two or more emotions that they find the most difficult to experience 3. Patient will demonstrate positive conflict resolution skills through discussion and/or role plays  Summary of Patient Progress:  Invited, chose not to attend.    Therapeutic Modalities:  Cognitive Behavioral Therapy Feelings Identification Dialectical Behavioral Therapy   Ericberto Padget LCSWA Clinical Social Worker   

## 2017-09-01 NOTE — Progress Notes (Signed)
D   Pt in his room most of the shift and did not go to group   His interactions with others is limited   He is depressed and sad and reports feeling hopeless  A   Verbal support given    Medications administered and effectiveness monitored    Q 15 min checks  R   Pt is safe at present time

## 2017-09-01 NOTE — Progress Notes (Signed)
Patient ID: Thomas [name], male   DOB: 12-Dec-1985, 32 y.o.   MRN: [id number]  Pt currently presents with a flat affect and depressed behavior. Pt reports to writer that their goal is to "get some cream for this rash." Small open wounds noted on patients forehead, arms bilaterally and chest. Pt forwards little to writer otherwise. Pt reports intermittent sleep with current medication regimen.   Pt provided with scheduled and as needed medications per providers orders. Pt's labs and vitals were monitored throughout the night. Pt given a 1:1 about emotional and mental status. Pt supported and encouraged to express concerns and questions. Pt educated on medications and indications.   Pt's safety ensured with 15 minute and environmental checks. Pt currently denies SI/HI and A/V hallucinations. Pt verbally agrees to seek staff if SI/HI or A/VH occurs and to consult with staff before acting on any harmful thoughts. Will continue POC.

## 2017-09-02 MED ORDER — MIRTAZAPINE 7.5 MG PO TABS
7.5000 mg | ORAL_TABLET | Freq: Every day | ORAL | Status: DC
  Administered 2017-09-03: 7.5 mg via ORAL
  Filled 2017-09-02 (×3): qty 1

## 2017-09-02 NOTE — Progress Notes (Addendum)
Patient ID: Thomas [name], male   DOB: October 05, 1985, 32 y.o.   MRN: [id number]  Nursing Progress Note [id number]  Data: Patient presents with flat affect and depressed mood. Patient is cooperative with staff but has been isolative to his room. Patient is minimal when writer attempts to engage. Patient reports poor sleep. Patient complaint with scheduled medications. Patient denies pain/physical complaints. Patient provided but declined to complete their self-inventory sheet. Patient currently reports passive SI thoughts. No intention to act on SI. Patient denying current HI/AVH.   Action: Patient educated about and provided medication per provider's orders. Patient safety maintained with q15 min safety checks and frequent rounding. Low fall risk precautions in place. Emotional support given. 1:1 interaction and active listening provided. Patient encouraged to attend meals and groups. Patient encouraged to work on treatment plan and goals. Labs, vital signs and patient behavior monitored throughout shift.   Response: Patient reports no intention to act on SI/HI. Patient agrees to come to staff if any thoughts of SI/HI develop or if patient develops intention of acting on thoughts. Patient remains safe on the unit at this time. Patient is interacting with peers appropriately on the unit. Will continue to support and monitor.

## 2017-09-02 NOTE — Progress Notes (Signed)
Patient did not attend wrap up group. 

## 2017-09-02 NOTE — Plan of Care (Signed)
Problem: Safety: Goal: Periods of time without injury will increase Intervention: Patient contracts for safety on the unit. Low fall risk precautions in place. Safety monitored with q15 minute checks. Outcome: Patient remains safe on the unit at this time. 09/02/2017 10:08 AM - Progressing by Ferrel Loganollazo, Jourdan Maldonado A, RN

## 2017-09-02 NOTE — Progress Notes (Signed)
[facility] MD Progress Note  09/02/2017 11:56 AM Thomas [name]  MRN:  [id number] Subjective: Reports he is still feeling depressed , but does acknowledge he is feeling better than he did prior to admission. He denies any ongoing hallucinations. Reports poor sleep. States GF came to visit last night and reports that their relationship is now better and that he can return home at discharge. Denies medication side effects.  Objective : I have discussed case with treatment team and have met with patient. 32 year old male, lives with girlfriend, on disability.  Presented due to depression and suicidal ideations.  History of methamphetamine dependence, had been using daily prior to admission. Patient reports ongoing depression, but acknowledges some improvement. Although still constricted, he does present with a more reactive affect today, smiles at times appropriately and presents with better eye contact with improved overall relatedness. As per staff patient remains isolative, with limited milieu, group participation. No agitated or overtly disruptive behaviors . Skin excoriations/lesions much improved, and described improved ( decreased ) pruritus. Currently denies hallucinations, no delusions or paranoid ideations expressed, and does not present internally preoccupied. Denies medication side effects ( On Abilify) .  Principal Problem: Substance-induced mood disorder , substance-induced psychosis , stimulant dependence  Diagnosis:   Patient Active Problem List   Diagnosis Date Noted  . Severe recurrent major depression with psychotic features ([place]) [F33.3] 08/31/2017  . MDD (major depressive disorder), recurrent severe, without psychosis ([place]) [F33.2] 02/27/2016  . Cocaine abuse with cocaine-induced disorder ([place]) [F14.19] 02/27/2016  . Right leg pain [M79.604] 08/29/2013  . LBP (low back pain) [M54.5]   . Insomnia [G47.00]    Total Time spent with patient: 20 minutes  Past Psychiatric History:    Past Medical History:  Past Medical History:  Diagnosis Date  . Insomnia   . LBP (low back pain)     Past Surgical History:  Procedure Laterality Date  . HAND SURGERY Right   . TOTAL HIP ARTHROPLASTY Right    Family History:  Family History  Problem Relation Age of Onset  . Sleep apnea Mother   . Diabetes Mother   . Heart disease Maternal Grandmother   . Heart disease Maternal Grandfather    Family Psychiatric  History:  Social History:  Social History   Substance and Sexual Activity  Alcohol Use No   Comment: Quit 2014     Social History   Substance and Sexual Activity  Drug Use Yes  . Types: Cocaine, Methamphetamines    Social History   Socioeconomic History  . Marital status: Unknown    Spouse name: Not on file  . Number of children: Not on file  . Years of education: 10th  . Highest education level: Not on file  Occupational History    Employer: UNEMPLOYED    Comment: disabled  Social Needs  . Financial resource strain: Not on file  . Food insecurity:    Worry: Not on file    Inability: Not on file  . Transportation needs:    Medical: Not on file    Non-medical: Not on file  Tobacco Use  . Smoking status: Never Smoker  . Smokeless tobacco: Never Used  Substance and Sexual Activity  . Alcohol use: No    Comment: Quit 2014  . Drug use: Yes    Types: Cocaine, Methamphetamines  . Sexual activity: Yes    Birth control/protection: Condom  Lifestyle  . Physical activity:    Days per week: Not on file  Minutes per session: Not on file  . Stress: Not on file  Relationships  . Social connections:    Talks on phone: Not on file    Gets together: Not on file    Attends religious service: Not on file    Active member of club or organization: Not on file    Attends meetings of clubs or organizations: Not on file    Relationship status: Not on file  Other Topics Concern  . Not on file  Social History Narrative   Patient lives at home with her  Domingo Mend [place])    Disabled.   Caffeine eight cans of diet mountain dews.   Education 10 th grade.   Additional Social History:    Prescriptions: NONE CURRENTLY History of alcohol / drug use?: Yes Longest period of sobriety (when/how long): 1 YEAR & 3 YEARS Negative Consequences of Use: Personal relationships, Financial Withdrawal Symptoms: Irritability, Tingling, Nausea / Vomiting, Patient aware of relationship between substance abuse and physical/medical complications, Blackouts, Weakness Name of Substance 1: METHAMPHETAMINE 1 - Age of First Use: 28 1 - Amount (size/oz): 1 TO 1 1/2 GRAMS 1 - Frequency: DAILY 1 - Duration: ONGOING 1 - Last Use / Amount: yesterday Name of Substance 2: CANNABIS 2 - Age of First Use: SINCE TEEN 2 - Amount (size/oz): VARIES 2 - Frequency: 1 X WEEK 2 - Duration: ONGOING 2 - Last Use / Amount: LAST WEEK Name of Substance 3: COCAINE (HX) 3 - Age of First Use: 20s 3 - Amount (size/oz): UNKNOWN 3 - Frequency: UNKNOWN 3 - Duration: YEARS 3 - Last Use / Amount: AUGUST 2018  Sleep: Fair  Appetite:  improving  Current Medications: Current Facility-Administered Medications  Medication Dose Route Frequency Provider Last Rate Last Dose  . acetaminophen (TYLENOL) tablet 650 mg  650 mg Oral Q6H PRN Rozetta Nunnery, NP      . alum & mag hydroxide-simeth (MAALOX/MYLANTA) 200-200-20 MG/5ML suspension 30 mL  30 mL Oral Q4H PRN Lindon Romp A, NP      . ARIPiprazole (ABILIFY) tablet 10 mg  10 mg Oral Daily Josephus Harriger, Myer Peer, MD   10 mg at 09/02/17 2725  . hydrOXYzine (ATARAX/VISTARIL) tablet 25 mg  25 mg Oral Q6H PRN Allyse Fregeau, Myer Peer, MD      . magnesium hydroxide (MILK OF MAGNESIA) suspension 30 mL  30 mL Oral Daily PRN Lindon Romp A, NP      . traZODone (DESYREL) tablet 50 mg  50 mg Oral QHS PRN Rozetta Nunnery, NP        Lab Results:  No results found for this or any previous visit (from the past 14 hour(s)).  Blood Alcohol level:  Lab Results   Component Value Date   ETH <10 08/31/2017   ETH <5 36/64/4034    Metabolic Disorder Labs: Lab Results  Component Value Date   HGBA1C 4.8 08/31/2017   MPG 91.06 08/31/2017   No results found for: PROLACTIN Lab Results  Component Value Date   CHOL 133 08/31/2017   TRIG 147 08/31/2017   HDL 32 (L) 08/31/2017   CHOLHDL 4.2 08/31/2017   VLDL 29 08/31/2017   LDLCALC 72 08/31/2017    Physical Findings: AIMS: Facial and Oral Movements Muscles of Facial Expression: None, normal Lips and Perioral Area: None, normal Jaw: None, normal Tongue: None, normal,Extremity Movements Upper (arms, wrists, hands, fingers): None, normal Lower (legs, knees, ankles, toes): None, normal, Trunk Movements Neck, shoulders, hips: None, normal, Overall Severity Severity  of abnormal movements (highest score from questions above): None, normal Incapacitation due to abnormal movements: None, normal Patient's awareness of abnormal movements (rate only patient's report): No Awareness, Dental Status Current problems with teeth and/or dentures?: No Does patient usually wear dentures?: No  CIWA:    COWS:     Musculoskeletal: Strength & Muscle Tone: within normal limits Gait & Station: normal Patient leans: N/A  Psychiatric Specialty Exam: Physical Exam  ROS denies headache, no chest pain, no shortness of breath, reports improving pruritus on skin excoriations, no fever, no chills  Blood pressure 120/60, pulse 86, temperature 98.6 F (37 C), resp. rate 16, height _0  (1.778 m), weight 113.4 kg (250 lb), SpO2 100 %.Body mass index is 35.87 kg/m.  General Appearance: Fairly Groomed  Eye Contact:  improving   Speech:  Normal Rate  Volume:  Normal  Mood:  reports still depressed but some improvement compared to admission  Affect:  less constricted, smiles briefly at times   Thought Process:  Linear and Descriptions of Associations: Intact  Orientation:  Other:  Fully alert and attentive  Thought  Content:  no hallucinations, no delusions, not internally proeccupied   Suicidal Thoughts:  No today denies suicidal ideations, denies self-injurious ideations, contracts for safety on unit  Homicidal Thoughts:  No denies homicidal ideations and specifically also denies any homicidal or violent ideations towards significant other  Memory:  Recent and remote fair  Judgement:  Fair-improving  Insight:  Fair  Psychomotor Activity:  Decreased  Concentration:  Concentration: improving  and Attention Span: improving  Recall:  Good  Fund of Knowledge:  Good  Language:  Good  Akathisia:  Negative  Handed:  Right  AIMS (if indicated):     Assets:  Desire for Improvement Resilience  ADL's:  Intact  Cognition:  WNL  Sleep:  Number of Hours: 6.75   Assessment -32 year old male, history of methamphetamine dependence,  presented for worsening depression, passive suicidal ideations, reported visual hallucinations (shadows) and ruminations about infidelity.  Patient remains depressed, but reports some improvement in mood and is presenting with a partially improved range of affect. Denies suicidal ideations. Reports fair sleep. Psychotic symptoms and paranoid ideations have subsided /improved . Currently does not endorse or present with psychotic symptoms. Thus far tolerating Abilify trial well.  We discussed treatment options- agrees to Remeron trial to address depression and insomnia.     Treatment Plan Summary: Daily contact with patient to assess and evaluate symptoms and progress in treatment, Medication management, Plan Inpatient treatment and Medications as below  Treatment Plan reviewed as below today 6/13 Encourage participation in milieu to work on coping skills and symptom reduction Encourage efforts to work on sobriety and relapse prevention Treatment team working on disposition planning  Continue  Abilify  10 mg daily for mood disorder D/C Trazodone  Start Remeron 7.5 mgrs QHS for  depression and insomnia  Continue Hydroxyzine 25 mg every 6 hours PRN for anxiety or pruritus Jenne Campus, MD 09/02/2017, 11:56 AM   Patient ID: Thomas [name], male   DOB: Nov 16, 1985, 32 y.o.   MRN: [id number]

## 2017-09-02 NOTE — Progress Notes (Signed)
Pt did not attend orientation/goals group this morning.   

## 2017-09-02 NOTE — [facility] Group Notes (Signed)
LCSW Group Therapy Note  09/02/2017 1:15pm  Type of Therapy/Topic:  Group Therapy:  Feelings about Diagnosis  Participation Level:  Did Not Attend--pt invited. Chose to remain in bed.    Description of Group:   This group will allow patients to explore their thoughts and feelings about diagnoses they have received. Patients will be guided to explore their level of understanding and acceptance of these diagnoses. Facilitator will encourage patients to process their thoughts and feelings about the reactions of others to their diagnosis and will guide patients in identifying ways to discuss their diagnosis with significant others in their lives. This group will be process-oriented, with patients participating in exploration of their own experiences, giving and receiving support, and processing challenge from other group members.   Therapeutic Goals: 1. Patient will demonstrate understanding of diagnosis as evidenced by identifying two or more symptoms of the disorder 2. Patient will be able to express two feelings regarding the diagnosis 3. Patient will demonstrate their ability to communicate their needs through discussion and/or role play  Summary of Patient Progress:    x   Therapeutic Modalities:   Cognitive Behavioral Therapy Brief Therapy Feelings Identification    Rona RavensHeather S Soha Thorup, LCSW 09/02/2017 11:41 AM

## 2017-09-03 NOTE — [facility] Group Notes (Signed)
LCSW Group Therapy Note   09/03/2017 1:15pm   Type of Therapy and Topic:  Group Therapy:  Overcoming Obstacles   Participation Level:  Did Not Attend--pt invited. Chose to remain in bed.    Description of Group:    In this group patients will be encouraged to explore what they see as obstacles to their own wellness and recovery. They will be guided to discuss their thoughts, feelings, and behaviors related to these obstacles. The group will process together ways to cope with barriers, with attention given to specific choices patients can make. Each patient will be challenged to identify changes they are motivated to make in order to overcome their obstacles. This group will be process-oriented, with patients participating in exploration of their own experiences as well as giving and receiving support and challenge from other group members.   Therapeutic Goals: 1. Patient will identify personal and current obstacles as they relate to admission. 2. Patient will identify barriers that currently interfere with their wellness or overcoming obstacles.  3. Patient will identify feelings, thought process and behaviors related to these barriers. 4. Patient will identify two changes they are willing to make to overcome these obstacles:      Summary of Patient Progress     x Therapeutic Modalities:   Cognitive Behavioral Therapy Solution Focused Therapy Motivational Interviewing Relapse Prevention Therapy  Rona RavensHeather S Sybol Morre, LCSW 09/03/2017 1:56 PM

## 2017-09-03 NOTE — Progress Notes (Signed)
Psychoeducational Group Note  Date:  09/03/2017 Time:  2312  Group Topic/Focus:  Wrap-Up Group:   The focus of this group is to help patients review their daily goal of treatment and discuss progress on daily workbooks.  Participation Level: Did Not Attend  Participation Quality:  Not Applicable  Affect:  Not Applicable  Cognitive:  Not Applicable  Insight:  Not Applicable  Engagement in Group: Not Applicable  Additional Comments:  The patient did not attend the evening A.A.meeting despite being encouraged to do so.   Hazle CocaGOODMAN, Howie Rufus S 09/03/2017, 11:12 PM

## 2017-09-03 NOTE — Progress Notes (Signed)
Pt did not attend goals/orientation group this morning.  

## 2017-09-03 NOTE — Plan of Care (Signed)
Patient verbalizes understanding of information, education provided to him.  Patient remains isolative to room. Does not participate in therapeutic programming (other than 1 group today).

## 2017-09-03 NOTE — Progress Notes (Signed)
D: Patient observed resting in bed and continues to state, "I'm catching up on sleep." Reminded patient nursing staff has reported he has slept, has remained isolative since he was admitted.  "I haven't gotten much sleep outside of here before coming in."  Patient states he did attend afternoon nursing group which was helpful. Patient's affect flat, mood ambivalent. Reports hallucinations have resolved.  Denies pain, physical complaints at present.   A: No meds ordered at this time (2000), no prns requested or required.  Level III obs in place for safety. Emotional support offered. Patient encouraged to complete Suicide Safety Plan before discharge. Encouraged to attend and participate in unit programming.  Explained to patient the importance of developing coping strategies.  R: Patient verbalizes understanding of POC. Patient denies SI/HI/AVH and remains safe on level III obs. Will continue to monitor throughout the night.

## 2017-09-03 NOTE — Progress Notes (Signed)
Recreation Therapy Notes  Date: 6.14.19 Time: 0930 Location: [address] Dayroom  Group Topic: Stress Management  Goal Area(s) Addresses:  Patient will verbalize importance of using healthy stress management.  Patient will identify positive emotions associated with healthy stress management.   Intervention: Stress Management  Activity :  Progressive Muscle Relaxation.  LRT introduced the stress management technique of progressive muscle relaxation.  LRT read a script to guide patients in doing the activity.  Patients were to follow along as script was read to engage in activity.  Education:  Stress Management, Discharge Planning.   Education Outcome: Acknowledges edcuation/In group clarification offered/Needs additional education  Clinical Observations/Feedback: Pt did not attend group.      Caroll RancherMarjette Dela Sweeny, LRT/CTRS         Caroll RancherLindsay, Morell Mears A 09/03/2017 11:17 AM

## 2017-09-03 NOTE — Progress Notes (Signed)
  [facility] :  Will you be returning to the same living situation after discharge:  Yes,  home At discharge, do you have transportation home?: Yes,  fiance-PATIENT IS SCHEDULED FOR DISCHARGE ON SUN, 6/16 PER DR. COBOS. Do you have the ability to pay for your medications: Yes,  Medicare  Release of information consent forms completed and submitted to medical records by CSW.  Patient to Follow up at: Follow-up Information    [facility] Follow up on 09/06/2017.   Why:  Hospital follow-up on Monday, 6/17 at 9:45AM. Please bring: photo ID, social security card, medicare card, and any proof of income if you have it. Thank you.  Contact information: [address] [place] [id number] [phone]           Next level of care provider has access [facility] Link:no  Safety Planning and Suicide Prevention discussed: Yes,  SPE completed with pt and his fiance. SPI pamphlet and Mobile Crisis information provided to pt.   Have you used any form of tobacco in the last 30 days? (Cigarettes, Smokeless Tobacco, Cigars, and/or Pipes): Patient Refused Screening  Has patient been referred to the Quitline?: Patient refused referral  Patient has been referred for addiction treatment: Yes  Rona RavensHeather S Christy Friede, LCSW 09/03/2017, 11:46 AM

## 2017-09-03 NOTE — Progress Notes (Signed)
Patient ID: Thomas [name], male   DOB: 1985/10/24, 32 y.o.   MRN: [id number]  Pt currently presents with a masked affect and cooperative behavior. Reports ongoing depression. No longer endorsing any itching in skin. Pt continues to remain in bed except for meals. Pt states goal is to "feel better" and he intends to do so by "getting more sleep." Adheres to medication regimen, hygiene has improved.  Pt provided with medications per providers orders. Pt's labs and vitals were monitored throughout the night. Pt given a 1:1 about emotional and mental status. Pt supported and encouraged to express concerns and questions. Pt educated on physiology of depression. Pt encouraged to attend groups and remain in the dayroom today.   Pt's safety ensured with 15 minute and environmental checks. Pt currently denies SI/HI and A/V hallucinations. Pt verbally agrees to seek staff if SI/HI or A/VH occurs and to consult with staff before acting on any harmful thoughts. Will continue POC.

## 2017-09-03 NOTE — Progress Notes (Signed)
[facility] MD Progress Note  09/03/2017 12:51 PM Thomas [name]  MRN:  [id number] Subjective: Patient reports he is feeling better, however he states he still feels "tired" and does not feel he is at his baseline level of functioning yet- he does acknowledge improving mood and resolution of psychotic symptoms.  States he has not seeing any "shadows" since admission and is no longer concerned or worried about significant other being unfaithful, states" the drug may be paranoid". Reports improving skin discomfort/improving pruritus. Currently denies suicidal ideations. Denies medication side effects.  Objective : I have discussed case with treatment team and have met with patient. 32 year old male, lives with girlfriend, on disability.  Presented due to depression and suicidal ideations.  History of methamphetamine dependence, had been using daily prior to admission. Patient is presenting with gradually improving mood and range of affect, but continues to present somewhat depressed and labile, was intermittently tearful during session, particularly when discussing relationship issues, expresses guilt regarding how his drug use has affected his relationship with his significant other.  Of note states that she is supportive, also abuses substances, but that they have both agreed to "get clean together". Currently tolerating medications well. Remains isolative, but to a lesser degree and is more visible on the unit and better related on approach/more verbal.  No disruptive or agitated behaviors on unit.   Principal Problem: Substance-induced mood disorder , substance-induced psychosis , stimulant dependence  Diagnosis:   Patient Active Problem List   Diagnosis Date Noted  . Severe recurrent major depression with psychotic features ([place]) [F33.3] 08/31/2017  . MDD (major depressive disorder), recurrent severe, without psychosis ([place]) [F33.2] 02/27/2016  . Cocaine abuse with cocaine-induced disorder ([place])  [F14.19] 02/27/2016  . Right leg pain [M79.604] 08/29/2013  . LBP (low back pain) [M54.5]   . Insomnia [G47.00]    Total Time spent with patient: 20 minutes  Past Psychiatric History:   Past Medical History:  Past Medical History:  Diagnosis Date  . Insomnia   . LBP (low back pain)     Past Surgical History:  Procedure Laterality Date  . HAND SURGERY Right   . TOTAL HIP ARTHROPLASTY Right    Family History:  Family History  Problem Relation Age of Onset  . Sleep apnea Mother   . Diabetes Mother   . Heart disease Maternal Grandmother   . Heart disease Maternal Grandfather    Family Psychiatric  History:  Social History:  Social History   Substance and Sexual Activity  Alcohol Use No   Comment: Quit 2014     Social History   Substance and Sexual Activity  Drug Use Yes  . Types: Cocaine, Methamphetamines    Social History   Socioeconomic History  . Marital status: Unknown    Spouse name: Not on file  . Number of children: Not on file  . Years of education: 10th  . Highest education level: Not on file  Occupational History    Employer: UNEMPLOYED    Comment: disabled  Social Needs  . Financial resource strain: Not on file  . Food insecurity:    Worry: Not on file    Inability: Not on file  . Transportation needs:    Medical: Not on file    Non-medical: Not on file  Tobacco Use  . Smoking status: Never Smoker  . Smokeless tobacco: Never Used  Substance and Sexual Activity  . Alcohol use: No    Comment: Quit 2014  . Drug use: Yes  Types: Cocaine, Methamphetamines  . Sexual activity: Yes    Birth control/protection: Condom  Lifestyle  . Physical activity:    Days per week: Not on file    Minutes per session: Not on file  . Stress: Not on file  Relationships  . Social connections:    Talks on phone: Not on file    Gets together: Not on file    Attends religious service: Not on file    Active member of club or organization: Not on file     Attends meetings of clubs or organizations: Not on file    Relationship status: Not on file  Other Topics Concern  . Not on file  Social History Narrative   Patient lives at home with her Domingo Mend [place])    Disabled.   Caffeine eight cans of diet mountain dews.   Education 10 th grade.   Additional Social History:    Prescriptions: NONE CURRENTLY History of alcohol / drug use?: Yes Longest period of sobriety (when/how long): 1 YEAR & 3 YEARS Negative Consequences of Use: Personal relationships, Financial Withdrawal Symptoms: Irritability, Tingling, Nausea / Vomiting, Patient aware of relationship between substance abuse and physical/medical complications, Blackouts, Weakness Name of Substance 1: METHAMPHETAMINE 1 - Age of First Use: 28 1 - Amount (size/oz): 1 TO 1 1/2 GRAMS 1 - Frequency: DAILY 1 - Duration: ONGOING 1 - Last Use / Amount: yesterday Name of Substance 2: CANNABIS 2 - Age of First Use: SINCE TEEN 2 - Amount (size/oz): VARIES 2 - Frequency: 1 X WEEK 2 - Duration: ONGOING 2 - Last Use / Amount: LAST WEEK Name of Substance 3: COCAINE (HX) 3 - Age of First Use: 20s 3 - Amount (size/oz): UNKNOWN 3 - Frequency: UNKNOWN 3 - Duration: YEARS 3 - Last Use / Amount: AUGUST 2018  Sleep: Improving  Appetite:  improving  Current Medications: Current Facility-Administered Medications  Medication Dose Route Frequency Provider Last Rate Last Dose  . acetaminophen (TYLENOL) tablet 650 mg  650 mg Oral Q6H PRN Lindon Romp A, NP      . alum & mag hydroxide-simeth (MAALOX/MYLANTA) 200-200-20 MG/5ML suspension 30 mL  30 mL Oral Q4H PRN Lindon Romp A, NP      . ARIPiprazole (ABILIFY) tablet 10 mg  10 mg Oral Daily Marlos Carmen, Myer Peer, MD   10 mg at 09/03/17 0753  . hydrOXYzine (ATARAX/VISTARIL) tablet 25 mg  25 mg Oral Q6H PRN Mikko Lewellen, Myer Peer, MD   25 mg at 09/03/17 0753  . magnesium hydroxide (MILK OF MAGNESIA) suspension 30 mL  30 mL Oral Daily PRN Lindon Romp A, NP      .  mirtazapine (REMERON) tablet 7.5 mg  7.5 mg Oral QHS Kiondra Caicedo, Myer Peer, MD        Lab Results:  No results found for this or any previous visit (from the past 48 hour(s)).  Blood Alcohol level:  Lab Results  Component Value Date   ETH <10 08/31/2017   ETH <5 01/60/1093    Metabolic Disorder Labs: Lab Results  Component Value Date   HGBA1C 4.8 08/31/2017   MPG 91.06 08/31/2017   No results found for: PROLACTIN Lab Results  Component Value Date   CHOL 133 08/31/2017   TRIG 147 08/31/2017   HDL 32 (L) 08/31/2017   CHOLHDL 4.2 08/31/2017   VLDL 29 08/31/2017   LDLCALC 72 08/31/2017    Physical Findings: AIMS: Facial and Oral Movements Muscles of Facial Expression: None, normal Lips and Perioral  Area: None, normal Jaw: None, normal Tongue: None, normal,Extremity Movements Upper (arms, wrists, hands, fingers): None, normal Lower (legs, knees, ankles, toes): None, normal, Trunk Movements Neck, shoulders, hips: None, normal, Overall Severity Severity of abnormal movements (highest score from questions above): None, normal Incapacitation due to abnormal movements: None, normal Patient's awareness of abnormal movements (rate only patient's report): No Awareness, Dental Status Current problems with teeth and/or dentures?: No Does patient usually wear dentures?: No  CIWA:    COWS:     Musculoskeletal: Strength & Muscle Tone: within normal limits Gait & Station: normal Patient leans: N/A  Psychiatric Specialty Exam: Physical Exam  ROS denies headache, no chest pain, no shortness of breath, reports improving pruritus on skin excoriations, no fever, no chills  Blood pressure (!) 135/92, pulse (!) 101, temperature 98 F (36.7 C), temperature source Oral, resp. rate 18, height 5' 10" (1.778 m), weight 113.4 kg (250 lb), SpO2 100 %.Body mass index is 35.87 kg/m.  General Appearance: Improving grooming  Eye Contact:  Better eye contact  Speech:  Normal Rate  Volume:  Normal   Mood:  Partially improved mood  Affect:  Still labile but more reactive overall  Thought Process:  Linear and Descriptions of Associations: Intact  Orientation:  Other:  Fully alert and attentive  Thought Content:  no hallucinations, no delusions, not internally proeccupied   Suicidal Thoughts:  No today denies suicidal ideations, denies self-injurious ideations, contracts for safety on unit  Homicidal Thoughts:  No denies homicidal ideations and specifically also denies any homicidal or violent ideations towards significant other  Memory:  Recent and remote fair  Judgement:  improving  Insight:  Fair  Psychomotor Activity:  Decreased, improving   Concentration:  Concentration: improving  and Attention Span: improving  Recall:  Good  Fund of Knowledge:  Good  Language:  Good  Akathisia:  Negative  Handed:  Right  AIMS (if indicated):     Assets:  Desire for Improvement Resilience  ADL's:  Intact  Cognition:  WNL  Sleep:  Number of Hours: 6.75   Assessment - patient is presenting with gradually/partially improving mood and range of affect.  Psychotic symptoms have improved/resolved and currently does not endorse hallucinations or present internally preoccupied and does not express persecutory or delusional ideations. Insight into negative impact that stimulant abuse is having on his quality of life, mental health is improving, motivated in sobriety.  We discussed disposition options, encouraged him to consider rehab.  At this time prefers to return to the community at discharge. Currently tolerating Abilify/Remeron trials well.    Treatment Plan Summary: Daily contact with patient to assess and evaluate symptoms and progress in treatment, Medication management, Plan Inpatient treatment and Medications as below  Treatment Plan reviewed as below today 6/14 Encourage participation in milieu to work on coping skills and symptom reduction Encourage efforts to work on sobriety and  relapse prevention Treatment team working on disposition planning  Continue  Abilify  10 mg daily for mood disorder Continue Remeron 7.5 mgrs QHS for depression and insomnia  Continue Hydroxyzine 25 mg every 6 hours PRN for anxiety or pruritus Jenne Campus, MD 09/03/2017, 12:51 PM   Patient ID: Thomas [name], male   DOB: [birth year], 32 y.o.   MRN: [id number]

## 2017-09-04 MED ORDER — MIRTAZAPINE 7.5 MG PO TABS
7.5000 mg | ORAL_TABLET | Freq: Every day | ORAL | Status: DC
  Administered 2017-09-04: 7.5 mg via ORAL
  Filled 2017-09-04 (×4): qty 1

## 2017-09-04 NOTE — [facility] Group Notes (Signed)
LCSW Group Therapy Note  09/04/2017    10:00-11:00am   Type of Therapy and Topic:  Group Therapy: Anger and Coping Skills  Participation Level:  Did Not Attend   Description of Group:   In this group, patients learned how to recognize the physical, cognitive, emotional, and behavioral responses they have to anger-provoking situations.  They identified how they usually or often react when angered, and learned how healthy and unhealthy coping skills work initially, but the unhealthy ones stop working.   They analyzed how their frequently-chosen coping skill is possibly beneficial and how it is possibly unhelpful.  The group discussed a variety of healthier coping skills that could help in resolving the actual issues, as well as how to go about planning for the the possibility of future similar situations.  Therapeutic Goals: 1. Patients will identify one thing that makes them angry and how they feel emotionally and physically, what their thoughts are or tend to be in those situations, and what healthy or unhealthy coping mechanism they typically use 2. Patients will identify how their coping technique works for them, as well as how it works against them. 3. Patients will explore possible new behaviors to use in future anger situations. 4. Patients will learn that anger itself is normal and cannot be eliminated, and that healthier coping skills can assist with resolving conflict rather than worsening situations.  Summary of Patient Progress:  N/A  Therapeutic Modalities:   Cognitive Behavioral Therapy Motivation Interviewing  Lynnell ChadMareida J Grossman-Orr  .

## 2017-09-04 NOTE — Progress Notes (Signed)
Patient ID: Thomas [name], male   DOB: Dec 15, 1985, 32 y.o.   MRN: [id number]   Per State regulations 482.30 this chart was reviewed for medical necessity with respect to the patient's admission/duration of stay.    Next review date: 09/08/17  Thomas [name], BSN, RN-BC  Case Manager

## 2017-09-04 NOTE — [facility] Group Notes (Signed)
[facility] Group Notes:  (Nursing)  Date:  09/04/2017  Time:  1:15 PM  Type of Therapy:  Nurse Education  Participation Level:  Did Not Attend  Participation Quality:  Did not attend  Affect:  did not attend  Cognitive:  Did not attend  Insight:  None  Engagement in Group:  None  Modes of Intervention:  Did not attend  Summary of Progress/Problems:  Shela NevinValerie S Kaity Pitstick 09/04/2017, 2:32 PM

## 2017-09-04 NOTE — Progress Notes (Signed)
Adult Psychoeducational Group Note  Date:  09/04/2017 Time:  9:38 PM  Group Topic/Focus:  Wrap-Up Group:   The focus of this group is to help patients review their daily goal of treatment and discuss progress on daily workbooks.  Participation Level:  Did Not Attend  Participation Quality:    Affect:    Cognitive:    Insight:   Engagement in Group:    Modes of Intervention:    Additional Comments: Pt did not attend wrap up group this evening.   Wandell Scullion A 09/04/2017, 9:38 PM

## 2017-09-04 NOTE — Progress Notes (Signed)
D. Pt resting in bed with eyes closed upon initial approach- up only for am medication and to meet with staff.  Pt presents with flat affect, guarded, isolative behavior- did not attend group led by SW this am. Pt denies pain, and SI/HI and AV hallucinations A. Labs and vitals monitored. Pt compliant with medications. Pt supported emotionally and encouraged to express concerns and ask questions.   R. Pt remains safe with 15 minute checks. Will continue POC.

## 2017-09-04 NOTE — Progress Notes (Addendum)
[name] MD Progress Note  09/04/2017 2:38 PM Thomas [name]  MRN:  [id number] Subjective:Im tired today. The medicine they gave me made me sleep hard. This day 4 for me, that I have been without.   Objective : I have discussed case with treatment team and have met with patient. 32 year old male, lives with girlfriend, on disability.  Presented due to depression and suicidal ideations.  History of methamphetamine dependence, had been using daily prior to admission. Patient presents with improved mood and affect, he appears to be more engaged with staff during original evaluation. He is observed sleeping, but open to going to groups and maxmize his treatments in the facility, instead of sleeping. He does not that the medications are making him sleepy, and his medications were adjusted. He had a good visit with his wife last night. " My wife is happy that I am getting the help I need to be better." he reports having some ongoing withdraw symptoms still to included fatigue, weakness. He also endorses some mild depressive symptoms to include isolation and withdrawn. His goal today is to get out of the bed. He denies any anxiety at this time. He denies any urges, cravings or suicidal thoughts. He is tolerating his medication well with the exception of mirtazapine.   No disruptive or agitated behaviors on unit.   Principal Problem: Substance-induced mood disorder , substance-induced psychosis , stimulant dependence  Diagnosis:   Patient Active Problem List   Diagnosis Date Noted  . Severe recurrent major depression with psychotic features ([name]) [F33.3] 08/31/2017  . MDD (major depressive disorder), recurrent severe, without psychosis ([name]) [F33.2] 02/27/2016  . Cocaine abuse with cocaine-induced disorder ([name]) [F14.19] 02/27/2016  . Right leg pain [M79.604] 08/29/2013  . LBP (low back pain) [M54.5]   . Insomnia [G47.00]    Total Time spent with patient: 20 minutes  Past Psychiatric History:   Past  Medical History:  Past Medical History:  Diagnosis Date  . Insomnia   . LBP (low back pain)     Past Surgical History:  Procedure Laterality Date  . HAND SURGERY Right   . TOTAL HIP ARTHROPLASTY Right    Family History:  Family History  Problem Relation Age of Onset  . Sleep apnea Mother   . Diabetes Mother   . Heart disease Maternal Grandmother   . Heart disease Maternal Grandfather    Family Psychiatric  History:  Social History:  Social History   Substance and Sexual Activity  Alcohol Use No   Comment: Quit 2014     Social History   Substance and Sexual Activity  Drug Use Yes  . Types: Cocaine, Methamphetamines    Social History   Socioeconomic History  . Marital status: Unknown    Spouse name: Not on file  . Number of children: Not on file  . Years of education: 10th  . Highest education level: Not on file  Occupational History    Employer: [employer] [name]    Comment: disabled  Social Needs  . Financial resource strain: Not on file  . Food insecurity:    Worry: Not on file    Inability: Not on file  . Transportation needs:    Medical: Not on file    Non-medical: Not on file  Tobacco Use  . Smoking status: Never Smoker  . Smokeless tobacco: Never Used  Substance and Sexual Activity  . Alcohol use: No    Comment: Quit 2014  . Drug use: Yes    Types: Cocaine,  Methamphetamines  . Sexual activity: Yes    Birth control/protection: Condom  Lifestyle  . Physical activity:    Days per week: Not on file    Minutes per session: Not on file  . Stress: Not on file  Relationships  . Social connections:    Talks on phone: Not on file    Gets together: Not on file    Attends religious service: Not on file    Active member of club or organization: Not on file    Attends meetings of clubs or organizations: Not on file    Relationship status: Not on file  Other Topics Concern  . Not on file  Social History Narrative   Patient lives at home with her Thomas [name]  Thomas [name])    Disabled.   Caffeine eight cans of diet mountain dews.   Education 10 th grade.   Additional Social History:    Prescriptions: NONE CURRENTLY History of alcohol / drug use?: Yes Longest period of sobriety (when/how long): 1 YEAR & 3 YEARS Negative Consequences of Use: Personal relationships, Financial Withdrawal Symptoms: Irritability, Tingling, Nausea / Vomiting, Patient aware of relationship between substance abuse and physical/medical complications, Blackouts, Weakness Name of Substance 1: METHAMPHETAMINE 1 - Age of First Use: 28 1 - Amount (size/oz): 1 TO 1 1/2 GRAMS 1 - Frequency: DAILY 1 - Duration: ONGOING 1 - Last Use / Amount: yesterday Name of Substance 2: CANNABIS 2 - Age of First Use: SINCE TEEN 2 - Amount (size/oz): VARIES 2 - Frequency: 1 X WEEK 2 - Duration: ONGOING 2 - Last Use / Amount: LAST WEEK Name of Substance 3: COCAINE (HX) 3 - Age of First Use: 20s 3 - Amount (size/oz): UNKNOWN 3 - Frequency: UNKNOWN 3 - Duration: YEARS 3 - Last Use / Amount: AUGUST 2018  Sleep: Improving  Appetite:  improving  Current Medications: Current Facility-Administered Medications  Medication Dose Route Frequency Provider Last Rate Last Dose  . acetaminophen (TYLENOL) tablet 650 mg  650 mg Oral Q6H PRN Lindon Romp A, NP      . alum & mag hydroxide-simeth (MAALOX/MYLANTA) 200-200-20 MG/5ML suspension 30 mL  30 mL Oral Q4H PRN Lindon Romp A, NP      . ARIPiprazole (ABILIFY) tablet 10 mg  10 mg Oral Daily Jonavon Trieu, Myer Peer, MD   10 mg at 09/04/17 0926  . hydrOXYzine (ATARAX/VISTARIL) tablet 25 mg  25 mg Oral Q6H PRN Qamar Rosman, Myer Peer, MD   25 mg at 09/03/17 0753  . magnesium hydroxide (MILK OF MAGNESIA) suspension 30 mL  30 mL Oral Daily PRN Lindon Romp A, NP      . mirtazapine (REMERON) tablet 7.5 mg  7.5 mg Oral QHS Starkes, Gayland Curry, FNP        Lab Results:  No results found for this or any previous visit (from the past 48 hour(s)).  Blood Alcohol level:   Lab Results  Component Value Date   ETH <10 08/31/2017   ETH <5 00/17/4944    Metabolic Disorder Labs: Lab Results  Component Value Date   HGBA1C 4.8 08/31/2017   MPG 91.06 08/31/2017   No results found for: PROLACTIN Lab Results  Component Value Date   CHOL 133 08/31/2017   TRIG 147 08/31/2017   HDL 32 (L) 08/31/2017   CHOLHDL 4.2 08/31/2017   VLDL 29 08/31/2017   LDLCALC 72 08/31/2017    Physical Findings: AIMS: Facial and Oral Movements Muscles of Facial Expression: None, normal Lips and Perioral Area: None,  normal Jaw: None, normal Tongue: None, normal,Extremity Movements Upper (arms, wrists, hands, fingers): None, normal Lower (legs, knees, ankles, toes): None, normal, Trunk Movements Neck, shoulders, hips: None, normal, Overall Severity Severity of abnormal movements (highest score from questions above): None, normal Incapacitation due to abnormal movements: None, normal Patient's awareness of abnormal movements (rate only patient's report): No Awareness, Dental Status Current problems with teeth and/or dentures?: No Does patient usually wear dentures?: No  CIWA:    COWS:     Musculoskeletal: Strength & Muscle Tone: within normal limits Gait & Station: normal Patient leans: N/A  Psychiatric Specialty Exam: Physical Exam   ROS  denies headache, no chest pain, no shortness of breath, reports improving pruritus on skin excoriations, no fever, no chills  Blood pressure (!) 129/91, pulse (!) 116, temperature 98 F (36.7 C), temperature source Oral, resp. rate 18, height 5' 10"  (1.778 m), weight 113.4 kg (250 lb), SpO2 100 %.Body mass index is 35.87 kg/m.  General Appearance: Improving grooming  Eye Contact:  Better eye contact  Speech:  Normal Rate  Volume:  Normal  Mood:  much better'  Affect:  Congruent  Thought Process:  Linear and Descriptions of Associations: Intact  Orientation:  Other:  Fully alert and attentive  Thought Content:  no  hallucinations, no delusions, not internally proeccupied   Suicidal Thoughts:  No today denies suicidal ideations, denies self-injurious ideations, contracts for safety on unit  Homicidal Thoughts:  No denies homicidal ideations and specifically also denies any homicidal or violent ideations towards significant other  Memory:  Recent and remote fair  Judgement:  improving  Insight:  Fair  Psychomotor Activity:  Decreased, improving   Concentration:  Concentration: improving  and Attention Span: improving  Recall:  Good  Fund of Knowledge:  Good  Language:  Good  Akathisia:  Negative  Handed:  Right  AIMS (if indicated):     Assets:  Desire for Improvement Resilience  ADL's:  Intact  Cognition:  WNL  Sleep:  Number of Hours: 6.75   Assessment - patient is presenting with gradually/partially improving mood and range of affect.  Psychotic symptoms have improved/resolved and currently does not endorse hallucinations or present internally preoccupied and does not express persecutory or delusional ideations. Insight into negative impact that stimulant abuse is having on his quality of life, mental health is improving, motivated in sobriety.  We discussed disposition options, encouraged him to consider rehab.  At this time prefers to return to the community at discharge. Currently tolerating Abilify/Remeron trials well.    Treatment Plan Summary: Daily contact with patient to assess and evaluate symptoms and progress in treatment, Medication management, Plan Inpatient treatment and Medications as below  Treatment Plan reviewed as below today 6/14 Encourage participation in milieu to work on coping skills and symptom reduction Encourage efforts to work on sobriety and relapse prevention Treatment team working on disposition planning  Continue  Abilify  10 mg daily for mood disorder Continue Remeron 7.5 mgrs QHS for depression and insomnia  Continue Hydroxyzine 25 mg every 6 hours PRN for  anxiety or pruritus Nanci Pina, FNP 09/04/2017, 2:38 PM   Patient ID: Thomas [name], male   DOB: [birth year], 32 y.o.   MRN: [id number] .Marland KitchenMarland KitchenAgree with NP Progress Note

## 2017-09-04 NOTE — Progress Notes (Signed)
D.  Pt pleasant on approach, has remained in bed most of shift so far.  Pt did get up for snacks but did not get up for group tonight.  Pt denies complaints at this time, denies SI/HI/AVH .  A.  Support and encouragement offered, medications given as ordered  R. Pt remains safe on the unit, will continue to monitor.

## 2017-09-05 DIAGNOSIS — F1524 Other stimulant dependence with stimulant-induced mood disorder: Secondary | ICD-10-CM

## 2017-09-05 MED ORDER — MIRTAZAPINE 7.5 MG PO TABS: 7.5000 mg | ORAL_TABLET | Freq: Every day | ORAL | 0 refills | Status: DC

## 2017-09-05 MED ORDER — HYDROXYZINE HCL 25 MG PO TABS: 25.0000 mg | ORAL_TABLET | Freq: Four times a day (QID) | ORAL | 0 refills | Status: DC | PRN

## 2017-09-05 MED ORDER — ARIPIPRAZOLE 10 MG PO TABS: 10.0000 mg | ORAL_TABLET | Freq: Every day | ORAL | 0 refills | Status: DC

## 2017-09-05 NOTE — Discharge Summary (Addendum)
Physician Discharge Summary Note  Patient:  Thomas [name] is an 32 y.o., male MRN:  [id number] DOB:  [birth year] Patient phone:  [phone] (home)  Patient address:   [address] [place] [address],  Total Time spent with patient: 30 minutes  Date of Admission:  08/31/2017 Date of Discharge: 09/05/2017  Reason for Admission:  32 year old male, lives with SO and children, on disability. Reports chronic depression, which he feels has been worsening . Reports neuro-vegetative symptoms of depression as below, and describes passive SI , described as " just wanting to die". States he has had increased arguments and conflict with his GF and worries he is going to lose his family. He reports he recently held a knife to himself during an argument with his GF, threatened to cut self . He also reports increased irritability recently, and has had verbal altercations with " random people". Describes long history of methamphetamine dependence, and expresses insight into the negative impact it is having on his mood and relationship issues . He reports frequent psychotic symptoms , " seeing shadows ", and states he has had increased concerns that his GF is cheating on him.  Associated Signs/Symptoms: Depression Symptoms:  depressed mood, anhedonia, insomnia, suicidal thoughts without plan, anxiety, loss of energy/fatigue, decreased appetite, states he has lost about 60 lbs over the last year (Hypo) Manic Symptoms: irritability Anxiety Symptoms:  Increased anxiety Psychotic Symptoms:  Reports history of " seeing shadows of people", thinking " my wife is being unfaithful", which he states he realizes are likely drug induced . At this time not internally preoccupied . PTSD Symptoms: Denies history of PTSD symptoms.  Past Psychiatric History: history of prior psychiatric admission in 2017. At the time presented with depression, SI, cocaine use disorder. States he has never attempted  suicide, denies history of self cutting. History of skin picking which he attributes to methamphetamine use . Describes history of visual hallucinations, " like seeing shadows". States he has been diagnosed with Bipolar Disorder in the past . Denies panic attacks, describes some agoraphobia.  Substance Abuse History in the last 12 months:  Denies alcohol abuse,  Endorses history of methamphetamine use disorder, states " I became addicted  4 years ago". Endorses history of  cannabis abuse, last used 5 months ago. Denies IVDA.  Consequences of Substance Abuse:  Previous Psychotropic Medications: was not taking any psychiatric medications prior to admission,he states he had been on Lamictal several months ago, but states " it made me feel weird so I stopped ".  Psychological Evaluations:  No   Principal Problem: <principal problem not specified> Discharge Diagnoses: Patient Active Problem List   Diagnosis Date Noted  . 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 Severe recurrent major depression with psychotic features (HCC) [F33.3] 08/31/2017  . MDD (major depressive disorder), recurrent severe, without psychosis (HCC) [F33.2] 02/27/2016  . 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 Right leg pain [M79.604] 08/29/2013  . LBP (low back pain) [M54.5]   . Insomnia [G47.00]      Past Medical History:  Past Medical History:  Diagnosis Date  . Insomnia   . LBP (low back pain)     Past Surgical History:  Procedure Laterality Date  . HAND SURGERY Right   . TOTAL HIP ARTHROPLASTY Right    Family History:  Family History  Problem Relation Age of Onset  . Sleep apnea Mother   . Diabetes Mother   .  Heart disease Maternal Grandmother   . Heart disease Maternal Grandfather    Family Psychiatric  History: states mother has bipolar disorder and a remote history of cocaine use disorder, denies history of suicides in family   Tobacco Screening: does not smoke  or use tobacco products  Social History: 32 year old male, lives with SO, has two children ( ages 4,6, currently with their mother), on disability. Denies legal issues   Social History   Substance and Sexual Activity  Alcohol Use No   Comment: Quit 2014     Social History   Substance and Sexual Activity  Drug Use Yes  . Types: Cocaine, Methamphetamines    Social History   Socioeconomic History  . Marital status: Unknown    Spouse name: Not on file  . Number of children: Not on file  . Years of education: 10th  . Highest education level: Not on file  Occupational History    Employer: UNEMPLOYED    Comment: disabled  Social Needs  . Financial resource strain: Not on file  . Food insecurity:    Worry: Not on file    Inability: Not on file  . Transportation needs:    Medical: Not on file    Non-medical: Not on file  Tobacco Use  . Smoking status: Never Smoker  . Smokeless tobacco: Never Used  Substance and Sexual Activity  . Alcohol use: No    Comment: Quit 2014  . Drug use: Yes    Types: Cocaine, Methamphetamines  . Sexual activity: Yes    Birth control/protection: Condom  Lifestyle  . Physical activity:    Days per week: Not on file    Minutes per session: Not on file  . Stress: Not on file  Relationships  . Social connections:    Talks on phone: Not on file    Gets together: Not on file    Attends religious service: Not on file    Active member of club or organization: Not on file    Attends meetings of clubs or organizations: Not on file    Relationship status: Not on file  Other Topics Concern  . Not on file  Social History Narrative   Patient lives at home with her Marchia Meiers Wilkie Aye)    Disabled.   Caffeine eight cans of diet mountain dews.   Education 10 th grade.    Hospital Course:  Thomas [name] was admitted for <principal problem not specified> and crisis management.  He was treated with the following  medications Abilify and Mirtazapine.  Thomas [name] was discharged with current medication and was instructed on how to take medications as prescribed; (details listed below under Medication List).  Medical problems were identified and treated as needed.  Home medications were restarted as appropriate.  Improvement was monitored by observation and Thomas Paganini daily report of symptom reduction.  Emotional and mental status was monitored by daily self-inventory reports completed by Thomas Paganini and clinical staff.         Thomas [name] was evaluated by the treatment team for stability and plans for continued recovery upon discharge.  Thomas [name] motivation was an integral factor for scheduling further treatment.  Employment, transportation, bed availability, health status, family support, and any pending legal issues were also considered during his hospital stay.  He was offered further treatment options upon discharge including but not limited to Residential, Intensive Outpatient, and Outpatient treatment.  Thomas [name] will follow up  with the services as listed below under Follow Up Information.     Upon completion of this admission the Thomas Paganini was both mentally and medically stable for discharge denying suicidal/homicidal ideation, auditory/visual/tactile hallucinations, delusional thoughts and paranoia.      Physical Findings: AIMS: Facial and Oral Movements Muscles of Facial Expression: None, normal Lips and Perioral Area: None, normal Jaw: None, normal Tongue: None, normal,Extremity Movements Upper (arms, wrists, hands, fingers): None, normal Lower (legs, knees, ankles, toes): None, normal, Trunk Movements Neck, shoulders, hips: None, normal, Overall Severity Severity of abnormal movements (highest score from questions above): None, normal Incapacitation due to abnormal movements: None, normal Patient's awareness of abnormal movements (rate only  patient's report): No Awareness, Dental Status Current problems with teeth and/or dentures?: No Does patient usually wear dentures?: No  CIWA:    COWS:     Musculoskeletal: Strength & Muscle Tone: within normal limits Gait & Station: normal Patient leans: N/A  Psychiatric Specialty Exam:See MD SRA Physical Exam  ROS  Blood pressure (!) 129/91, pulse (!) 116, temperature 98 F (36.7 C), temperature source Oral, resp. rate 18, height 5\' 10"  (1.778 m), weight 113.4 kg (250 lb), SpO2 100 %.Body mass index is 35.87 kg/m.  Sleep:  Number of Hours: 6.5     Have you used any form of tobacco in the last 30 days? (Cigarettes, Smokeless Tobacco, Cigars, and/or Pipes): Patient Refused Screening  Has this patient used any form of tobacco in the last 30 days? (Cigarettes, Smokeless Tobacco, Cigars, and/or Pipes)  Yes, A prescription for an FDA-approved tobacco cessation medication was offered at discharge and the patient refused  Blood Alcohol level:  Lab Results  Component Value Date   [facility] <10 08/31/2017   ETH <5 02/27/2016    Metabolic Disorder Labs:  Lab Results  Component Value Date   HGBA1C 4.8 08/31/2017   MPG 91.06 08/31/2017   No results found for: PROLACTIN Lab Results  Component Value Date   CHOL 133 08/31/2017   TRIG 147 08/31/2017   HDL 32 (L) 08/31/2017   CHOLHDL 4.2 08/31/2017   VLDL 29 08/31/2017   LDLCALC 72 08/31/2017    See Psychiatric Specialty Exam and Suicide Risk Assessment completed by Attending Physician prior to discharge.  Discharge destination:  Home  Is patient on multiple antipsychotic therapies at discharge:  No   Has Patient had three or more failed trials of antipsychotic monotherapy by history:  No  Recommended Plan for Multiple Antipsychotic Therapies: NA  Discharge Instructions    Discharge instructions   Complete by:  As directed    Please continue to take medications as directed. If your symptoms return, worsen, or persist please  call your 911, report to local ER, or contact crisis hotline. Please do not drink alcohol or use any illegal substances while taking prescription medications.     Allergies as of 09/05/2017   No Known Allergies     Medication List    TAKE these medications     Indication  ARIPiprazole 10 MG tablet Commonly known as:  ABILIFY Take 1 tablet (10 mg total) by mouth daily. Start taking on:  09/06/2017  Indication:  mood stabilization   hydrOXYzine 25 MG tablet Commonly known as:  ATARAX/VISTARIL Take 1 tablet (25 mg total) by mouth every 6 (six) hours as needed for anxiety.  Indication:  ANXIETY NEUROSIS (INACTIVE)   mirtazapine 7.5 MG tablet Commonly known as:  REMERON Take 1 tablet (7.5 mg total) by mouth at bedtime.  Indication:  Major Depressive Disorder      Follow-up Information    [facility] Follow up on 09/06/2017.   Why:  Hospital follow-up on Monday, 6/17 at 9:45AM. Please bring: photo ID, social security card, medicare card, and any proof of income if you have it. Thank you.  Contact information: [address] [place] [address] [phone]           Follow-up recommendations:  Activity:  Increase activity as tolerated Diet:  Routine house diet Tests:  Recommend outpatient labs as suggested by outpatient psychiatrist.  Other:  Make sure to follow up with outpatient therapist.     Signed: Truman Hayward, FNP 09/05/2017, 11:38 AM  Patient seen, Suicide Assessment Completed.  Disposition Plan Reviewed

## 2017-09-05 NOTE — [facility] Group Notes (Signed)
[facility] LCSW Group Therapy Note  09/05/2017  10:00-11:00AM  Type of Therapy and Topic:  Group Therapy:  Being Your Own Support  Participation Level:  Minimal   Description of Group:  Patients in this group were introduced to the concept that self-support is an essential part of recovery.  A song entitled "My Own Hero" was played and a group discussion ensued in which patients stated they could relate to the song and it inspired them to realize they have be willing to help themselves in order to succeed, because other people cannot achieve sobriety or stability for them.  We discussed adding a variety of healthy supports to address the various needs in their lives.  A song was played called "I Know Where I've Been" toward the end of group and used to conduct an inspirational wrap-up to group of remembering how far they have already come in their journey.  Therapeutic Goals: 1)  demonstrate the importance of being a part of one's own support system 2)  discuss reasons people in one's life may eventually be unable to be continually supportive  3)  identify the patient's current support system and   4)  elicit commitments to add healthy supports and to become more conscious of being self-supportive   Summary of Patient Progress:  The patient was not present at the beginning of group, but was there for the last 15 minutes and participated well.  He was excited to be leaving today, did express some concern about whether his wife is still using or not, stating that would have a big impact on his own sobriety.  He was given encouragement from the group to stick up for his own needs.   Therapeutic Modalities:   Motivational Interviewing Activity  Lynnell ChadMareida J Grossman-Orr

## 2017-09-05 NOTE — Progress Notes (Signed)
Pt discharged to lobby. Wife in lobby to take patient home. Pt was stable and appreciative at that time. All papers and prescriptions were given and valuables returned. Verbal understanding expressed. Denies SI/HI and A/VH. Pt given opportunity to express concerns and ask questions.

## 2017-09-05 NOTE — Progress Notes (Signed)
Pt did not attend goals and orientation group this morning  

## 2017-09-05 NOTE — [facility] Suicide Risk Assessment (Signed)
[facility] Discharge Suicide Risk Assessment   Principal Problem:  Methamphetamine Use Disorder, Substance Induced Mood Disorder versus MDD Discharge Diagnoses:  Patient Active Problem List   Diagnosis Date Noted  . Severe recurrent major depression with psychotic features (HCC) [F33.3] 08/31/2017  . MDD (major depressive disorder), recurrent severe, without psychosis (HCC) [F33.2] 02/27/2016  . 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 Right leg pain [M79.604] 08/29/2013  . LBP (low back pain) [M54.5]   . Insomnia [G47.00]     Total Time spent with patient: 30 minutes  Musculoskeletal: Strength & Muscle Tone: within normal limits Gait & Station: normal Patient leans: N/A  Psychiatric Specialty Exam: ROS no headache, no chest pain, no shortness of breath, no vomiting , skin lesions much improved, no longer pruriginous, no fever   Blood pressure (!) 129/91, pulse (!) 116, temperature 98 F (36.7 C), temperature source Oral, resp. rate 18, height 5\' 10"  (1.778 m), weight 113.4 kg (250 lb), SpO2 100 %.Body mass index is 35.87 kg/m.  General Appearance: improved grooming  Eye Contact::  Good  Speech:  Normal Rate409  Volume:  Normal  Mood:  reports " I am feeling a lot better", presents with improved mood  Affect:  Appropriate and fuller in range  Thought Process:  Linear and Descriptions of Associations: Intact  Orientation:  Other:  fully alert and attentive  Thought Content:  no hallucinations, no delusions, not internally preoccupied  Suicidal Thoughts:  No currently denies suicidal or self injurious ideations, denies any homicidal or violent ideations  Homicidal Thoughts:  No  Memory:  recent and remote grossly intact   Judgement:  Other:  fair- improving   Insight:  Fair and improving   Psychomotor Activity:  Normal  Concentration:  Good  Recall:  Good  Fund of Knowledge:Good  Language: Good  Akathisia:  Negative  Handed:  Right  AIMS (if  indicated):     Assets:  Communication Skills Desire for Improvement Resilience  Sleep:  Number of Hours: 6.5  Cognition: WNL  ADL's:  Intact   Mental Status Per Nursing Assessment::   On Admission:  Self-harm thoughts  Demographic Factors:  32 year old male , lives with GF, has two children, on disability   Loss Factors: Substance abuse, disability   Historical Factors: History of stimulant dependence, identifies methamphetamines as substance of choice   Risk Reduction Factors:   Responsible for children under 29 years of age, Sense of responsibility to family, Living with another person, especially a relative and Positive coping skills or problem solving skills  Continued Clinical Symptoms:  At this time patient is alert, attentive, presents with improving mood and states " I feel a lot better", affect is more reactive, smiles at times appropriately, no thought disorder, no suicidal or self injurious ideations, denies homicidal ideations, no current hallucinations, no delusions, not internally preoccupied .  He has been tolerating medications well, denies side effects, we have reviewed side effect profile , including risk of movement disorders, metabolic disorders, sedation. Advised not to drive if feeling sedated . Behavior on unit in good control, has been more visible on unit, presents pleasant on approach. With patient's express consent and in his presence I spoke with his GF on phone- she corroborates he is improved and is in agreement with discharge, she will pick him up later today   Cognitive Features That Contribute To Risk:  No gross cognitive deficits noted upon discharge. Is alert , attentive, and oriented x 3  Suicide Risk:  Mild:  Suicidal ideation of limited frequency, intensity, duration, and specificity.  There are no identifiable plans, no associated intent, mild dysphoria and related symptoms, good self-control (both objective and subjective assessment), few  other risk factors, and identifiable protective factors, including available and accessible social support.  Follow-up Information    [facility] Follow up on 09/06/2017.   Why:  Hospital follow-up on Monday, 6/17 at 9:45AM. Please bring: photo ID, social security card, medicare card, and any proof of income if you have it. Thank you.  Contact information: [address] [place] [address] [phone]           Plan Of Care/Follow-up recommendations:  Activity:  as tolerated  Diet:  regular Tests:  NA Other:  See below  Patient is expressing readiness for discharge and is leaving unit in good spirits . No current grounds for involuntary commitment  Plans to follow up as above- encouraged to attend NA and to avoid people, places and situations he associates with drug use   Craige CottaFernando A Edword Cu, MD 09/05/2017, 10:22 AM

## 2017-09-05 NOTE — Progress Notes (Signed)
D. Pt presents with an appropriate affect and calm, cooperative behavior, smiling this am upon initial interaction. Pt reports that he's "ready to go home". Pt currently denies SI/HI and A/V hallucinations. A. Labs and vitals monitored. Pt compliant with medications. Pt supported emotionally and encouraged to express concerns and ask questions.   R. Pt remains safe with 15 minute checks. Will continue POC.

## 2017-09-08 ENCOUNTER — Emergency Department (HOSPITAL_COMMUNITY)
Admission: EM | Admit: 2017-09-08 | Discharge: 2017-09-09 | Disposition: A | Discharge status: Home / Self Care | Payer: Medicare Other | Attending: Emergency Medicine | Admitting: Emergency Medicine

## 2017-09-08 ENCOUNTER — Other Ambulatory Visit: Payer: Self-pay

## 2017-09-08 ENCOUNTER — Encounter (HOSPITAL_COMMUNITY): Payer: Self-pay

## 2017-09-08 DIAGNOSIS — F1210 Cannabis abuse, uncomplicated: Secondary | ICD-10-CM | POA: Insufficient documentation

## 2017-09-08 DIAGNOSIS — R45850 Homicidal ideations: Secondary | ICD-10-CM | POA: Diagnosis not present

## 2017-09-08 DIAGNOSIS — F1510 Other stimulant abuse, uncomplicated: Secondary | ICD-10-CM | POA: Insufficient documentation

## 2017-09-08 DIAGNOSIS — R21 Rash and other nonspecific skin eruption: Secondary | ICD-10-CM | POA: Diagnosis not present

## 2017-09-08 DIAGNOSIS — Z79899 Other long term (current) drug therapy: Secondary | ICD-10-CM | POA: Diagnosis not present

## 2017-09-08 DIAGNOSIS — R45851 Suicidal ideations: Secondary | ICD-10-CM | POA: Diagnosis not present

## 2017-09-08 DIAGNOSIS — F315 Bipolar disorder, current episode depressed, severe, with psychotic features: Secondary | ICD-10-CM | POA: Insufficient documentation

## 2017-09-08 DIAGNOSIS — Z008 Encounter for other general examination: Secondary | ICD-10-CM | POA: Diagnosis present

## 2017-09-08 DIAGNOSIS — F332 Major depressive disorder, recurrent severe without psychotic features: Secondary | ICD-10-CM | POA: Diagnosis present

## 2017-09-08 HISTORY — DX: Other psychoactive substance abuse, uncomplicated: F19.10

## 2017-09-08 HISTORY — DX: Depression, unspecified: F32.A

## 2017-09-08 HISTORY — DX: Bipolar disorder, unspecified: F31.9

## 2017-09-08 HISTORY — DX: Major depressive disorder, single episode, unspecified: F32.9

## 2017-09-08 LAB — RAPID URINE DRUG SCREEN, HOSP PERFORMED
AMPHETAMINES: NOT DETECTED
Benzodiazepines: NOT DETECTED
Cocaine: NOT DETECTED
OPIATES: NOT DETECTED
Tetrahydrocannabinol: NOT DETECTED

## 2017-09-08 LAB — CBC
HCT: 41.8 % (ref 39.0–52.0)
HEMOGLOBIN: 14.8 g/dL (ref 13.0–17.0)
MCH: 30.5 pg (ref 26.0–34.0)
MCHC: 35.4 g/dL (ref 30.0–36.0)
MCV: 86.2 fL (ref 78.0–100.0)
PLATELETS: 302 10*3/uL (ref 150–400)
RBC: 4.85 MIL/uL (ref 4.22–5.81)
RDW: 12.5 % (ref 11.5–15.5)
WBC: 9.7 10*3/uL (ref 4.0–10.5)

## 2017-09-08 LAB — COMPREHENSIVE METABOLIC PANEL
ALBUMIN: 4.1 g/dL (ref 3.5–5.0)
ALT: 23 U/L (ref 17–63)
AST: 22 U/L (ref 15–41)
Alkaline Phosphatase: 67 U/L (ref 38–126)
Anion gap: 7 (ref 5–15)
BUN: 9 mg/dL (ref 6–20)
CHLORIDE: 104 mmol/L (ref 101–111)
CO2: 28 mmol/L (ref 22–32)
Calcium: 9 mg/dL (ref 8.9–10.3)
Creatinine, Ser: 0.88 mg/dL (ref 0.61–1.24)
GFR calc Af Amer: >60 mL/min (ref >60)
Glucose, Bld: 121 mg/dL — ABNORMAL HIGH (ref 65–99)
POTASSIUM: 4 mmol/L (ref 3.5–5.1)
Sodium: 139 mmol/L (ref 135–145)
Total Bilirubin: 0.2 mg/dL — ABNORMAL LOW (ref 0.3–1.2)
Total Protein: 7.6 g/dL (ref 6.5–8.1)

## 2017-09-08 LAB — ETHANOL

## 2017-09-08 LAB — ACETAMINOPHEN LEVEL: Acetaminophen (Tylenol), Serum: <10 ug/mL — ABNORMAL LOW (ref 10–30)

## 2017-09-08 LAB — SALICYLATE LEVEL: Salicylate Lvl: <7 mg/dL (ref 2.8–30.0)

## 2017-09-08 MED ORDER — ARIPIPRAZOLE 10 MG PO TABS
10.0000 mg | ORAL_TABLET | Freq: Every day | ORAL | Status: DC
  Administered 2017-09-09: 10 mg via ORAL
  Filled 2017-09-08: qty 1

## 2017-09-08 MED ORDER — HYDROXYZINE HCL 25 MG PO TABS
25.0000 mg | ORAL_TABLET | Freq: Four times a day (QID) | ORAL | Status: DC | PRN
  Administered 2017-09-08: 25 mg via ORAL
  Filled 2017-09-08: qty 1

## 2017-09-08 MED ORDER — MIRTAZAPINE 7.5 MG PO TABS
7.5000 mg | ORAL_TABLET | Freq: Every day | ORAL | Status: DC
  Administered 2017-09-08: 7.5 mg via ORAL
  Filled 2017-09-08: qty 1

## 2017-09-08 NOTE — ED Triage Notes (Addendum)
Patient states he is feeling suicidal and homicidal and states he does not have a plan because he is scared to think about it. Patient states he has anger issues. Patient states he last used Meth  9 days ago. Patient states he smoked marijuana 3 days ago. Patient denies any other drug use. patiaent states he does drink alcohol, but says it is social only.. Patient denies any hallucinations and states he is very restless at night.  Patient c/o rash on abdomen and bilateral arms.
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
## 2017-09-08 NOTE — ED Notes (Signed)
Pt A&O x 3, watching TV at present.  Monitoring for safety, Q 15 min checks in effect, remains passive SI, no distress noted, calm & cooperative.

## 2017-09-08 NOTE — ED Notes (Signed)
Bed: [facility] Expected date:  Expected time:  Means of arrival:  Comments: 

## 2017-09-08 NOTE — ED Notes (Signed)
Pt oriented to room and unit.  Pt is pleasant and cooperative.  Pt contracts for safety.  15 minute checks and video monitoring in place. 

## 2017-09-08 NOTE — ED Provider Notes (Signed)
[facility]-EMERGENCY DEPT Provider Note   CSN: [id number] Arrival date & time: 09/08/17  1603     History   Chief Complaint Chief Complaint  Patient presents with  . Suicidal  . Homicidal  . Rash    HPI Thomas [name] is a 32 y.o. male.  HPI Patient presents to the emergency room for evaluation of suicidal and homicidal ideations.  Patient states he has a history of methamphetamine use.  He has a history of bipolar disorder.  His symptoms started a while ago.  He was recently in the emergency room for similar symptoms.  At that time he was using meth daily.  At that time the patient contemplating stabbing himself with a knife.  He was seen in the ED on June 10 and ultimately admitted to behavioral health hospital.  Patient was discharged on June 16.  Patient has not followed up with his outpatient recommendations.  Since leaving the hospital he is feeling depressed suicidal and homicidal but has no specific complaints.  He came to the emergency room because of these persistent symptoms.  He feels like he needs to be hospitalized again.  Patient also continues to have some itching associated with a rash that has had prior to his hospitalization.  No fevers or chills.  No other symptoms. Past Medical History:  Diagnosis Date  . Bipolar 1 disorder (HCC)   . Depression   . Insomnia   . LBP (low back pain)   . Substance abuse [facility])     Patient Active Problem List   Diagnosis Date Noted  . 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 Severe recurrent major depression with psychotic features (HCC) 08/31/2017  . MDD (major depressive disorder), recurrent severe, without psychosis (HCC) 02/27/2016  . Cocaine abuse with cocaine-induced disorder (HCC) 02/27/2016  . Right leg pain 08/29/2013  . LBP (low back pain)   . Insomnia     Past Surgical History:  Procedure Laterality Date  . HAND SURGERY Right   . TOTAL HIP ARTHROPLASTY  Right         Home Medications    Prior to Admission medications   Medication Sig Start Date End Date Taking? Authorizing Provider  ARIPiprazole (ABILIFY) 10 MG tablet Take 1 tablet (10 mg total) by mouth daily. 09/06/17   Truman Hayward, FNP  hydrOXYzine (ATARAX/VISTARIL) 25 MG tablet Take 1 tablet (25 mg total) by mouth every 6 (six) hours as needed for anxiety. 09/05/17   Truman Hayward, FNP  mirtazapine (REMERON) 7.5 MG tablet Take 1 tablet (7.5 mg total) by mouth at bedtime. 09/05/17   Truman Hayward, FNP    Family History Family History  Problem Relation Age of Onset  . Sleep apnea Mother   . Diabetes Mother   . Heart disease Maternal Grandmother   . Heart disease Maternal Grandfather     Social History Social History   Tobacco Use  . Smoking status: Never Smoker  . Smokeless tobacco: Never Used  Substance Use Topics  . Alcohol use: Yes    Comment: social use  . Drug use: Yes    Types: Methamphetamines, Marijuana    Comment: Last use within last 10 days - UDS NA at time of ax     Allergies   Patient has no known allergies.   Review of Systems Review of Systems  All other systems reviewed and are negative.    Physical Exam Updated Vital Signs BP Marland Kitchen)  149/93 (BP Location: Left Arm)   Pulse (!) 101   Temp 97.6 F (36.4 C) (Oral)   Resp 17   Ht 1.778 m (5\' 10" )   Wt 99.8 kg (220 lb)   SpO2 100%   BMI 31.57 kg/m   Physical Exam  Constitutional: He appears well-developed and well-nourished. No distress.  HENT:  Head: Normocephalic and atraumatic.  Right Ear: External ear normal.  Left Ear: External ear normal.  Eyes: Conjunctivae are normal. Right eye exhibits no discharge. Left eye exhibits no discharge. No scleral icterus.  Neck: Neck supple. No tracheal deviation present.  Cardiovascular: Normal rate, regular rhythm and intact distal pulses.  Pulmonary/Chest: Effort normal and breath sounds normal. No stridor. No respiratory distress. He has  no wheezes. He has no rales.  Abdominal: Soft. Bowel sounds are normal. He exhibits no distension. There is no tenderness. There is no rebound and no guarding.  Musculoskeletal: He exhibits no edema or tenderness.  Neurological: He is alert. He has normal strength. No cranial nerve deficit (no facial droop, extraocular movements intact, no slurred speech) or sensory deficit. He exhibits normal muscle tone. He displays no seizure activity. Coordination normal.  Skin: Skin is warm and dry. Rash noted.  Evidence of hyperpigmented healing rash on his extremities, numerous areas of what appear to be excoriated macules and papules  Psychiatric: He has a normal mood and affect. His speech is not rapid and/or pressured. He is not agitated, not aggressive and not slowed. He expresses homicidal and suicidal ideation. He expresses no suicidal plans and no homicidal plans.  Nursing note and vitals reviewed.    ED Treatments / Results  Labs (all labs ordered are listed, but only abnormal results are displayed) Labs Reviewed  COMPREHENSIVE METABOLIC PANEL - Abnormal; Notable for the following components:      Result Value   Glucose, Bld 121 (*)    Total Bilirubin 0.2 (*)    All other components within normal limits  ACETAMINOPHEN LEVEL - Abnormal; Notable for the following components:   Acetaminophen (Tylenol), Serum <10 (*)    All other components within normal limits  RAPID URINE DRUG SCREEN, HOSP PERFORMED - Abnormal; Notable for the following components:   Barbiturates   (*)    Value: Result not available. Reagent lot number recalled by manufacturer.   All other components within normal limits  ETHANOL  SALICYLATE LEVEL  CBC     Procedures Procedures (including critical care time)  Medications Ordered in ED Medications  ARIPiprazole (ABILIFY) tablet 10 mg (10 mg Oral Not Given 09/08/17 1740)  hydrOXYzine (ATARAX/VISTARIL) tablet 25 mg (has no administration in time range)  mirtazapine  (REMERON) tablet 7.5 mg (has no administration in time range)     Initial Impression / Assessment and Plan / ED Course  I have reviewed the triage vital signs and the nursing notes.  Pertinent labs & imaging results that were available during my care of the patient were reviewed by me and considered in my medical decision making (see chart for details).  Clinical Course as of Sep 09 1831  Wed Sep 08, 2017  1718 Labs reviewed.  No significant abnormalities.  Patient is medically cleared for psychiatric evaluation   [JK]    Clinical Course User Index [JK] Linwood Dibbles, MD   Pt presents with recurrent suicidal ideation.   No specific plan.  Medically stable.  Pt was assessed by psychiatry.  Plan for overnight observation.  Final Clinical Impressions(s) / ED Diagnoses  Final diagnoses:  Suicidal ideation      Linwood DibblesKnapp, Kimmora Risenhoover, MD 09/08/17 [phone]

## 2017-09-09 DIAGNOSIS — F332 Major depressive disorder, recurrent severe without psychotic features: Secondary | ICD-10-CM

## 2017-09-09 DIAGNOSIS — R45851 Suicidal ideations: Secondary | ICD-10-CM | POA: Diagnosis not present

## 2017-09-09 NOTE — ED Notes (Signed)
Dr Sharma CovertNorman and Thomas ConesLaurie NP into see.  Pt reports that he came to the ED because he was having "suicidal thoughts...tried to harm myself...put a knife to my chest..."  Pt reports that his wife stopped him from hurting his self.  Pt denies current si/hi/ah, but reports that he still sees shadows. Pt reports that he and his wife had an argument earlier yesterday and that he was still angry at that time.  Pt reports that he has been under a lot of stress and has been having difficulty finding somewhere to stay. Pt reports that he has been clean from meth for 10 days, but did smoke pot on Sunday.  Pt reports that he has been taking his medications, but did not follow [facility] at [facility].

## 2017-09-09 NOTE — ED Notes (Signed)
On the phone 

## 2017-09-09 NOTE — BH Assessment (Signed)
[facility] Progress [facility]  Per Juanetta BeetsJacqueline [name], DO, this pt does not require psychiatric hospitalization at this time.  [facility] [place].  This has been included in pt's discharge instructions.  Pt's nurse has been notified.  Doylene Canninghomas Jaunita Mikels, MA Triage Specialist [phone]

## 2017-09-09 NOTE — Discharge Instructions (Signed)
For your behavioral health needs, you are advised to follow up with [facility].  Contact them at your earliest opportunity to schedule an intake appointment: ° °     [facility] °     [address] °     [place], [place] [address] °     [phone] °

## 2017-09-09 NOTE — ED Notes (Addendum)
Pt ambulatory to dc area with mHT, pr reports that he will call his Aunt to pick him up, belongings returned.

## 2017-09-09 NOTE — [facility] Suicide Risk Assessment (Cosign Needed)
Suicide Risk Assessment  Discharge Assessment   [facility]   Principal Problem: MDD (major depressive disorder), recurrent severe, without psychosis (HCC) Discharge Diagnoses:  Patient Active Problem List   Diagnosis Date Noted  . MDD (major depressive disorder), recurrent severe, without psychosis (HCC) [F33.2] 02/27/2016    Priority: High  . Cocaine abuse with cocaine-induced disorder [facility](HCC) [F14.19] 02/27/2016    Priority: High  . Suicidal ideation [R45.851]   . 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 Severe recurrent major depression with psychotic features (HCC) [F33.3] 08/31/2017  . Right leg pain [M79.604] 08/29/2013  . LBP (low back pain) [M54.5]   . Insomnia [G47.00]     Total Time spent with patient: 45 minutes  Musculoskeletal: Strength & Muscle Tone: within normal limits Gait & Station: normal Patient leans: N/A  Psychiatric Specialty Exam: Physical Exam  Constitutional: He is oriented to person, place, and time. He appears well-developed and well-nourished.  HENT:  Head: Normocephalic.  Respiratory: Effort normal.  Musculoskeletal: Normal range of motion.  Neurological: He is alert and oriented to person, place, and time.  Psychiatric: His speech is normal and behavior is normal. Thought content normal. His mood appears anxious. Cognition and memory are normal. He expresses impulsivity. He exhibits a depressed mood.   Review of Systems  Psychiatric/Behavioral: Positive for depression. Negative for hallucinations, memory loss, substance abuse and suicidal ideas. The patient is nervous/anxious. The patient does not have insomnia.   All other systems reviewed and are negative.  Blood pressure (!) 145/92, pulse 94, temperature 98.1 F (36.7 C), temperature source Oral, resp. rate 16, height 5\' 10"  (1.778 m), weight 220 lb (99.8 kg), SpO2 98 %.Body mass index is 31.57 kg/m. General Appearance:  Casual Eye Contact:  Good Speech:  Clear and Coherent and Normal Rate Volume:  Normal Mood:  Anxious and Depressed Affect:  Congruent and Depressed Thought Process:  Coherent, Goal Directed and Linear Orientation:  Full (Time, Place, and Person) Thought Content:  Logical Suicidal Thoughts:  No Homicidal Thoughts:  No Memory:  Immediate;   Good Recent;   Good Remote;   Fair Judgement:  Poor Insight:  Lacking Psychomotor Activity:  Normal Concentration:  Concentration: Good and Attention Span: Good Recall:  Good Fund of Knowledge:  Good Language:  Good Akathisia:  No Handed:  Right AIMS (if indicated):    Assets:  ArchitectCommunication Skills Financial Resources/Insurance Housing Social Support ADL's:  Intact Cognition:  WNL Sleep:   Good   Mental Status Per Nursing Assessment::   On Admission:   Suicidal ideation  Demographic Factors:  Male and Caucasian  Loss Factors: Financial problems/change in socioeconomic status  Historical Factors: Impulsivity  Risk Reduction Factors:   Sense of responsibility to family  Continued Clinical Symptoms:  Depression:   Comorbid alcohol abuse/dependence Impulsivity Alcohol/Substance Abuse/Dependencies  Cognitive Features That Contribute To Risk:  Closed-mindedness    Suicide Risk:  Minimal: No identifiable suicidal ideation.  Patients presenting with no risk factors but with morbid ruminations; may be classified as minimal risk based on the severity of the depressive symptoms    Plan Of Care/Follow-up recommendations:  Activity:  as tolerated Diet:  Heart Healthy  Laveda AbbeLaurie Britton Parks, NP 09/09/2017, 10:01 AM

## 2017-09-09 NOTE — ED Notes (Signed)
Pt alert/oriented denies si/hi/ah on dc. Written dc instructions reviewed with patient.  Pt encouraged to follow up with [facility] as directed, take his medications as directed and seek treatment/return for return/changes in suicidal thoughts or urges.  Pt verbalized understanding.
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
## 2017-09-10 DIAGNOSIS — F332 Major depressive disorder, recurrent severe without psychotic features: Secondary | ICD-10-CM | POA: Diagnosis not present

## 2017-10-13 DIAGNOSIS — F333 Major depressive disorder, recurrent, severe with psychotic symptoms: Secondary | ICD-10-CM | POA: Diagnosis not present

## 2018-07-25 DIAGNOSIS — R05 Cough: Secondary | ICD-10-CM | POA: Diagnosis not present

## 2018-07-25 DIAGNOSIS — J069 Acute upper respiratory infection, unspecified: Secondary | ICD-10-CM | POA: Diagnosis not present

## 2018-12-28 ENCOUNTER — Encounter: Payer: Self-pay | Admitting: Emergency Medicine

## 2018-12-28 ENCOUNTER — Emergency Department
Admission: EM | Admit: 2018-12-28 | Discharge: 2018-12-28 | Disposition: A | Discharge status: Home / Self Care | Payer: Medicare Other | Attending: Emergency Medicine | Admitting: Emergency Medicine

## 2018-12-28 ENCOUNTER — Other Ambulatory Visit: Payer: Self-pay

## 2018-12-28 ENCOUNTER — Emergency Department: Payer: Medicare Other

## 2018-12-28 DIAGNOSIS — Y998 Other external cause status: Secondary | ICD-10-CM | POA: Insufficient documentation

## 2018-12-28 DIAGNOSIS — F1510 Other stimulant abuse, uncomplicated: Secondary | ICD-10-CM | POA: Diagnosis not present

## 2018-12-28 DIAGNOSIS — S2231XA Fracture of one rib, right side, initial encounter for closed fracture: Secondary | ICD-10-CM | POA: Diagnosis not present

## 2018-12-28 DIAGNOSIS — Z79899 Other long term (current) drug therapy: Secondary | ICD-10-CM | POA: Insufficient documentation

## 2018-12-28 DIAGNOSIS — Y9389 Activity, other specified: Secondary | ICD-10-CM | POA: Insufficient documentation

## 2018-12-28 DIAGNOSIS — F1210 Cannabis abuse, uncomplicated: Secondary | ICD-10-CM | POA: Diagnosis not present

## 2018-12-28 DIAGNOSIS — Y92828 Other wilderness area as the place of occurrence of the external cause: Secondary | ICD-10-CM | POA: Insufficient documentation

## 2018-12-28 DIAGNOSIS — Z96641 Presence of right artificial hip joint: Secondary | ICD-10-CM | POA: Diagnosis not present

## 2018-12-28 DIAGNOSIS — S299XXA Unspecified injury of thorax, initial encounter: Secondary | ICD-10-CM | POA: Diagnosis present

## 2018-12-28 MED ORDER — HYDROCODONE-ACETAMINOPHEN 5-325 MG PO TABS: 1.0000 | ORAL_TABLET | Freq: Four times a day (QID) | ORAL | 0 refills | Status: DC | PRN
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
## 2018-12-28 NOTE — ED Notes (Signed)
Pt unable to sign e signature due to computer malfnx .  

## 2018-12-28 NOTE — Discharge Instructions (Signed)
Follow-up with your primary care provider or can no clinic acute care if any continued problems.  You may take ibuprofen for inflammation pain if you are driving or operating machinery.  When you are at home are not working you may take the [place] as needed for pain every 6 hours.  A fractured rib may take up to 6 weeks to completely heal.

## 2018-12-28 NOTE — ED Notes (Signed)
Pt.refuses to wear mask.

## 2019-09-25 DIAGNOSIS — R4182 Altered mental status, unspecified: Secondary | ICD-10-CM | POA: Diagnosis not present

## 2019-09-25 DIAGNOSIS — T50904A Poisoning by unspecified drugs, medicaments and biological substances, undetermined, initial encounter: Secondary | ICD-10-CM | POA: Diagnosis not present

## 2019-09-25 DIAGNOSIS — J9601 Acute respiratory failure with hypoxia: Secondary | ICD-10-CM | POA: Diagnosis not present

## 2019-09-25 DIAGNOSIS — R4020 Unspecified coma: Secondary | ICD-10-CM | POA: Diagnosis not present

## 2019-09-25 DIAGNOSIS — J690 Pneumonitis due to inhalation of food and vomit: Secondary | ICD-10-CM | POA: Diagnosis not present

## 2019-09-25 DIAGNOSIS — R404 Transient alteration of awareness: Secondary | ICD-10-CM | POA: Diagnosis not present

## 2019-09-25 DIAGNOSIS — G9341 Metabolic encephalopathy: Secondary | ICD-10-CM | POA: Diagnosis not present

## 2019-09-25 DIAGNOSIS — I499 Cardiac arrhythmia, unspecified: Secondary | ICD-10-CM | POA: Diagnosis not present

## 2019-09-26 DIAGNOSIS — J189 Pneumonia, unspecified organism: Secondary | ICD-10-CM | POA: Diagnosis not present

## 2019-09-26 DIAGNOSIS — Z9911 Dependence on respirator [ventilator] status: Secondary | ICD-10-CM | POA: Diagnosis not present

## 2019-09-26 DIAGNOSIS — J9600 Acute respiratory failure, unspecified whether with hypoxia or hypercapnia: Secondary | ICD-10-CM | POA: Diagnosis not present

## 2019-09-26 DIAGNOSIS — Z4682 Encounter for fitting and adjustment of non-vascular catheter: Secondary | ICD-10-CM | POA: Diagnosis not present

## 2019-09-26 DIAGNOSIS — J690 Pneumonitis due to inhalation of food and vomit: Secondary | ICD-10-CM | POA: Diagnosis not present

## 2019-09-26 DIAGNOSIS — J9601 Acute respiratory failure with hypoxia: Secondary | ICD-10-CM | POA: Diagnosis not present

## 2019-09-26 DIAGNOSIS — G9341 Metabolic encephalopathy: Secondary | ICD-10-CM | POA: Diagnosis not present

## 2019-09-26 DIAGNOSIS — R092 Respiratory arrest: Secondary | ICD-10-CM | POA: Diagnosis not present

## 2019-09-27 DIAGNOSIS — J690 Pneumonitis due to inhalation of food and vomit: Secondary | ICD-10-CM | POA: Diagnosis not present

## 2019-09-27 DIAGNOSIS — Z9911 Dependence on respirator [ventilator] status: Secondary | ICD-10-CM | POA: Diagnosis not present

## 2019-09-27 DIAGNOSIS — R092 Respiratory arrest: Secondary | ICD-10-CM | POA: Diagnosis not present

## 2019-09-27 DIAGNOSIS — R918 Other nonspecific abnormal finding of lung field: Secondary | ICD-10-CM | POA: Diagnosis not present

## 2019-09-27 DIAGNOSIS — J9601 Acute respiratory failure with hypoxia: Secondary | ICD-10-CM | POA: Diagnosis not present

## 2019-09-27 DIAGNOSIS — J189 Pneumonia, unspecified organism: Secondary | ICD-10-CM | POA: Diagnosis not present

## 2019-09-27 DIAGNOSIS — J9600 Acute respiratory failure, unspecified whether with hypoxia or hypercapnia: Secondary | ICD-10-CM | POA: Diagnosis not present

## 2019-09-27 DIAGNOSIS — G9341 Metabolic encephalopathy: Secondary | ICD-10-CM | POA: Diagnosis not present

## 2019-09-28 DIAGNOSIS — J9601 Acute respiratory failure with hypoxia: Secondary | ICD-10-CM | POA: Diagnosis not present

## 2019-09-28 DIAGNOSIS — G9341 Metabolic encephalopathy: Secondary | ICD-10-CM | POA: Diagnosis not present

## 2019-09-28 DIAGNOSIS — J9690 Respiratory failure, unspecified, unspecified whether with hypoxia or hypercapnia: Secondary | ICD-10-CM | POA: Diagnosis not present

## 2019-09-28 DIAGNOSIS — Z9911 Dependence on respirator [ventilator] status: Secondary | ICD-10-CM | POA: Diagnosis not present

## 2019-09-28 DIAGNOSIS — J9600 Acute respiratory failure, unspecified whether with hypoxia or hypercapnia: Secondary | ICD-10-CM | POA: Diagnosis not present

## 2019-09-28 DIAGNOSIS — J189 Pneumonia, unspecified organism: Secondary | ICD-10-CM | POA: Diagnosis not present

## 2019-09-28 DIAGNOSIS — R092 Respiratory arrest: Secondary | ICD-10-CM | POA: Diagnosis not present

## 2019-09-28 DIAGNOSIS — Z4682 Encounter for fitting and adjustment of non-vascular catheter: Secondary | ICD-10-CM | POA: Diagnosis not present

## 2019-09-28 DIAGNOSIS — J690 Pneumonitis due to inhalation of food and vomit: Secondary | ICD-10-CM | POA: Diagnosis not present

## 2019-09-29 DIAGNOSIS — J9601 Acute respiratory failure with hypoxia: Secondary | ICD-10-CM | POA: Diagnosis not present

## 2019-09-29 DIAGNOSIS — J9690 Respiratory failure, unspecified, unspecified whether with hypoxia or hypercapnia: Secondary | ICD-10-CM | POA: Diagnosis not present

## 2019-09-29 DIAGNOSIS — J189 Pneumonia, unspecified organism: Secondary | ICD-10-CM | POA: Diagnosis not present

## 2019-09-29 DIAGNOSIS — J690 Pneumonitis due to inhalation of food and vomit: Secondary | ICD-10-CM | POA: Diagnosis not present

## 2019-09-29 DIAGNOSIS — Z9911 Dependence on respirator [ventilator] status: Secondary | ICD-10-CM | POA: Diagnosis not present

## 2019-09-29 DIAGNOSIS — R092 Respiratory arrest: Secondary | ICD-10-CM | POA: Diagnosis not present

## 2019-09-29 DIAGNOSIS — J9600 Acute respiratory failure, unspecified whether with hypoxia or hypercapnia: Secondary | ICD-10-CM | POA: Diagnosis not present

## 2019-09-29 DIAGNOSIS — G9341 Metabolic encephalopathy: Secondary | ICD-10-CM | POA: Diagnosis not present

## 2019-09-30 DIAGNOSIS — J690 Pneumonitis due to inhalation of food and vomit: Secondary | ICD-10-CM | POA: Diagnosis not present

## 2019-09-30 DIAGNOSIS — J9601 Acute respiratory failure with hypoxia: Secondary | ICD-10-CM | POA: Diagnosis not present

## 2019-09-30 DIAGNOSIS — G9341 Metabolic encephalopathy: Secondary | ICD-10-CM | POA: Diagnosis not present

## 2019-09-30 DIAGNOSIS — J181 Lobar pneumonia, unspecified organism: Secondary | ICD-10-CM | POA: Diagnosis not present

## 2019-09-30 DIAGNOSIS — J9690 Respiratory failure, unspecified, unspecified whether with hypoxia or hypercapnia: Secondary | ICD-10-CM | POA: Diagnosis not present

## 2019-10-01 DIAGNOSIS — J690 Pneumonitis due to inhalation of food and vomit: Secondary | ICD-10-CM | POA: Diagnosis not present

## 2019-10-01 DIAGNOSIS — G9341 Metabolic encephalopathy: Secondary | ICD-10-CM | POA: Diagnosis not present

## 2019-10-01 DIAGNOSIS — J9601 Acute respiratory failure with hypoxia: Secondary | ICD-10-CM | POA: Diagnosis not present

## 2019-10-02 DIAGNOSIS — J9601 Acute respiratory failure with hypoxia: Secondary | ICD-10-CM | POA: Diagnosis not present

## 2019-10-02 DIAGNOSIS — G9341 Metabolic encephalopathy: Secondary | ICD-10-CM | POA: Diagnosis not present

## 2019-10-02 DIAGNOSIS — J690 Pneumonitis due to inhalation of food and vomit: Secondary | ICD-10-CM | POA: Diagnosis not present
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
## 2022-09-28 ENCOUNTER — Ambulatory Visit (HOSPITAL_COMMUNITY)
Admission: EM | Admit: 2022-09-28 | Discharge: 2022-09-28 | Disposition: A | Discharge status: Other Acute Inpatient Hospital | Payer: 59 | Attending: Registered Nurse | Admitting: Registered Nurse

## 2022-09-28 ENCOUNTER — Other Ambulatory Visit (HOSPITAL_COMMUNITY)
Admission: EM | Admit: 2022-09-28 | Discharge: 2022-10-02 | Disposition: A | Discharge status: Home / Self Care | Payer: 59 | Attending: Psychiatry | Admitting: Psychiatry

## 2022-09-28 ENCOUNTER — Encounter (HOSPITAL_COMMUNITY): Payer: Self-pay | Admitting: Registered Nurse

## 2022-09-28 DIAGNOSIS — F1914 Other psychoactive substance abuse with psychoactive substance-induced mood disorder: Secondary | ICD-10-CM | POA: Diagnosis not present

## 2022-09-28 DIAGNOSIS — F1523 Other stimulant dependence with withdrawal: Secondary | ICD-10-CM | POA: Insufficient documentation

## 2022-09-28 DIAGNOSIS — F909 Attention-deficit hyperactivity disorder, unspecified type: Secondary | ICD-10-CM | POA: Insufficient documentation

## 2022-09-28 DIAGNOSIS — F1910 Other psychoactive substance abuse, uncomplicated: Secondary | ICD-10-CM | POA: Diagnosis not present

## 2022-09-28 DIAGNOSIS — R400 Somnolence: Secondary | ICD-10-CM | POA: Insufficient documentation

## 2022-09-28 DIAGNOSIS — F1520 Other stimulant dependence, uncomplicated: Secondary | ICD-10-CM | POA: Diagnosis not present

## 2022-09-28 DIAGNOSIS — F15288 Other stimulant dependence with other stimulant-induced disorder: Secondary | ICD-10-CM | POA: Insufficient documentation

## 2022-09-28 DIAGNOSIS — E669 Obesity, unspecified: Secondary | ICD-10-CM | POA: Insufficient documentation

## 2022-09-28 DIAGNOSIS — F1994 Other psychoactive substance use, unspecified with psychoactive substance-induced mood disorder: Secondary | ICD-10-CM | POA: Diagnosis present

## 2022-09-28 DIAGNOSIS — F1593 Other stimulant use, unspecified with withdrawal: Secondary | ICD-10-CM

## 2022-09-28 DIAGNOSIS — F1210 Cannabis abuse, uncomplicated: Secondary | ICD-10-CM | POA: Insufficient documentation

## 2022-09-28 DIAGNOSIS — Z5900 Homelessness unspecified: Secondary | ICD-10-CM | POA: Insufficient documentation

## 2022-09-28 DIAGNOSIS — F1290 Cannabis use, unspecified, uncomplicated: Secondary | ICD-10-CM

## 2022-09-28 DIAGNOSIS — F32A Depression, unspecified: Secondary | ICD-10-CM | POA: Insufficient documentation

## 2022-09-28 DIAGNOSIS — R0683 Snoring: Secondary | ICD-10-CM | POA: Insufficient documentation

## 2022-09-28 DIAGNOSIS — F1524 Other stimulant dependence with stimulant-induced mood disorder: Secondary | ICD-10-CM | POA: Diagnosis not present

## 2022-09-28 DIAGNOSIS — F1514 Other stimulant abuse with stimulant-induced mood disorder: Secondary | ICD-10-CM | POA: Diagnosis present

## 2022-09-28 LAB — COMPREHENSIVE METABOLIC PANEL
ALT: 25 U/L (ref 0–44)
AST: 20 U/L (ref 15–41)
Albumin: 3.8 g/dL (ref 3.5–5.0)
Alkaline Phosphatase: 76 U/L (ref 38–126)
Anion gap: 10 (ref 5–15)
BUN: 13 mg/dL (ref 6–20)
CO2: 24 mmol/L (ref 22–32)
Calcium: 9.2 mg/dL (ref 8.9–10.3)
Chloride: 101 mmol/L (ref 98–111)
Creatinine, Ser: 1.07 mg/dL (ref 0.61–1.24)
GFR, Estimated: >60 mL/min (ref >60)
Glucose, Bld: 75 mg/dL (ref 70–99)
Potassium: 3.8 mmol/L (ref 3.5–5.1)
Sodium: 135 mmol/L (ref 135–145)
Total Bilirubin: 0.3 mg/dL (ref 0.3–1.2)
Total Protein: 7.2 g/dL (ref 6.5–8.1)

## 2022-09-28 LAB — LIPID PANEL
Cholesterol: 155 mg/dL (ref 0–200)
HDL: 33 mg/dL — ABNORMAL LOW (ref >40)
LDL Cholesterol: 106 mg/dL — ABNORMAL HIGH (ref 0–99)
Total CHOL/HDL Ratio: 4.7 RATIO
Triglycerides: 81 mg/dL (ref <150)
VLDL: 16 mg/dL (ref 0–40)

## 2022-09-28 LAB — URINALYSIS, ROUTINE W REFLEX MICROSCOPIC
Bilirubin Urine: NEGATIVE
Glucose, UA: NEGATIVE mg/dL
Hgb urine dipstick: NEGATIVE
Ketones, ur: NEGATIVE mg/dL
Leukocytes,Ua: NEGATIVE
Nitrite: NEGATIVE
Protein, ur: NEGATIVE mg/dL
Specific Gravity, Urine: 1.025 (ref 1.005–1.030)
pH: 5 (ref 5.0–8.0)

## 2022-09-28 LAB — CBC WITH DIFFERENTIAL/PLATELET
Abs Immature Granulocytes: 0.04 10*3/uL (ref 0.00–0.07)
Basophils Absolute: 0 10*3/uL (ref 0.0–0.1)
Basophils Relative: 1 %
Eosinophils Absolute: 0.2 10*3/uL (ref 0.0–0.5)
Eosinophils Relative: 2 %
HCT: 43.6 % (ref 39.0–52.0)
Hemoglobin: 15 g/dL (ref 13.0–17.0)
Immature Granulocytes: 1 %
Lymphocytes Relative: 26 %
Lymphs Abs: 2.1 10*3/uL (ref 0.7–4.0)
MCH: 29.5 pg (ref 26.0–34.0)
MCHC: 34.4 g/dL (ref 30.0–36.0)
MCV: 85.8 fL (ref 80.0–100.0)
Monocytes Absolute: 0.6 10*3/uL (ref 0.1–1.0)
Monocytes Relative: 7 %
Neutro Abs: 5.3 10*3/uL (ref 1.7–7.7)
Neutrophils Relative %: 63 %
Platelets: 273 10*3/uL (ref 150–400)
RBC: 5.08 MIL/uL (ref 4.22–5.81)
RDW: 11.7 % (ref 11.5–15.5)
WBC: 8.2 10*3/uL (ref 4.0–10.5)
nRBC: 0 % (ref 0.0–0.2)

## 2022-09-28 LAB — ETHANOL: Alcohol, Ethyl (B): <10 mg/dL (ref <10)

## 2022-09-28 LAB — MAGNESIUM: Magnesium: 2.1 mg/dL (ref 1.7–2.4)

## 2022-09-28 LAB — POCT URINE DRUG SCREEN - MANUAL ENTRY (I-SCREEN)
POC Amphetamine UR: POSITIVE — AB
POC Buprenorphine (BUP): NOT DETECTED
POC Cocaine UR: NOT DETECTED
POC Marijuana UR: POSITIVE — AB
POC Methadone UR: NOT DETECTED
POC Methamphetamine UR: POSITIVE — AB
POC Morphine: NOT DETECTED
POC Oxazepam (BZO): NOT DETECTED
POC Oxycodone UR: NOT DETECTED
POC Secobarbital (BAR): NOT DETECTED

## 2022-09-28 LAB — TSH: TSH: 1.683 u[IU]/mL (ref 0.350–4.500)

## 2022-09-28 MED ORDER — TRAZODONE HCL 50 MG PO TABS
50.0000 mg | ORAL_TABLET | Freq: Every evening | ORAL | Status: DC | PRN
  Administered 2022-09-29 – 2022-10-01 (×3): 50 mg via ORAL
  Filled 2022-09-28 (×3): qty 1

## 2022-09-28 MED ORDER — ACETAMINOPHEN 325 MG PO TABS
650.0000 mg | ORAL_TABLET | Freq: Four times a day (QID) | ORAL | Status: DC | PRN
  Administered 2022-09-30: 650 mg via ORAL
  Filled 2022-09-28: qty 2

## 2022-09-28 MED ORDER — ALUM & MAG HYDROXIDE-SIMETH 200-200-20 MG/5ML PO SUSP: 30.0000 mL | ORAL | Status: DC | PRN

## 2022-09-28 MED ORDER — GABAPENTIN 100 MG PO CAPS
200.0000 mg | ORAL_CAPSULE | Freq: Three times a day (TID) | ORAL | Status: DC
  Administered 2022-09-28 – 2022-10-01 (×8): 200 mg via ORAL
  Filled 2022-09-28 (×8): qty 2

## 2022-09-28 MED ORDER — FLUOXETINE HCL 20 MG PO CAPS
20.0000 mg | ORAL_CAPSULE | Freq: Every day | ORAL | Status: DC
  Administered 2022-09-28: 20 mg via ORAL
  Filled 2022-09-28 (×2): qty 1

## 2022-09-28 MED ORDER — ONDANSETRON HCL 4 MG PO TABS: 4.0000 mg | ORAL_TABLET | Freq: Three times a day (TID) | ORAL | Status: DC | PRN

## 2022-09-28 MED ORDER — MAGNESIUM HYDROXIDE 400 MG/5ML PO SUSP: 30.0000 mL | Freq: Every day | ORAL | Status: DC | PRN

## 2022-09-28 MED ORDER — HYDROXYZINE HCL 25 MG PO TABS: 25.0000 mg | ORAL_TABLET | Freq: Three times a day (TID) | ORAL | Status: DC | PRN

## 2022-09-28 NOTE — Progress Notes (Signed)
   09/28/22 1619  [facility] Triage Screening (Walk-ins at [facility] only)  How Did You Hear About [place]? Self  What Is the Reason for Your Visit/Call Today? Patient is a 37 y.o. male with a hx of Amphetamine-type Substance Use Disorder, Severe and depression who [facility] seeking detox treatment.  Patient reports using 1 gram of meth daily for the past 6 months, with last use 2 days ago.  Patient is tearful, stating he came here "knowing this is a place I can get some help."  Patient has completed residential SA Tx in [place] 5 yrs ago.  At this point he is looking for a week to two week detox/treatment options.  He denies SI, HI and AVH.  He does have some hallucinations and paranoia, "when I'm using."  He denies current AH or paranoia.  How Long Has This Been Causing You Problems? > than 6 months  Have You Recently Had Any Thoughts About Hurting Yourself? No  Are You Planning to Commit Suicide/Harm Yourself At This time? No  Have you Recently Had Thoughts About Hurting Someone Karolee Ohs? No  Are You Planning To Harm Someone At This Time? No  Are you currently experiencing any auditory, visual or other hallucinations? No  Have You Used Any Alcohol or Drugs in the Past 24 Hours? No  How long ago did you use Drugs or Alcohol? 2 days ago  What Did You Use and How Much? 1 gram meth  Do you have any current medical co-morbidities that require immediate attention? No  Clinician description of patient physical appearance/behavior: Patient is calm, tearful, cooperative AAOx5  What Do You Feel Would Help You the Most Today? Alcohol or Drug Use Treatment;Treatment for Depression or other mood problem  If access to [facility] was not available, would you have sought care in the Emergency Department? No  Determination of Need Urgent (48 hours)  Options For Referral Facility-Based Crisis

## 2022-09-28 NOTE — BH Assessment (Signed)
Comprehensive Clinical Assessment (CCA) Screening, Triage and Referral Note  09/28/2022 Thomas [name] [id number]  Disposition: Per Assunta Found, NP admission to [facility] is recommended.    The patient demonstrates the following risk factors for suicide: Chronic risk factors for suicide include: psychiatric disorder of Substance Induced Mood Disorder, substance use disorder, and demographic factors (male, >39 y/o). Acute risk factors for suicide include: social withdrawal/isolation and loss (financial, interpersonal, professional). Protective factors for this patient include: positive social support, responsibility to others (children, family), and coping skills. Considering these factors, the overall suicide risk at this point appears to be low. Patient is appropriate for outpatient follow up, once stabilized.    Patient is a 37 y.o. male with a hx of Amphetamine-type Substance Use Disorder, Severe and Substance Induced Mood Disorder who [facility].  Patient reports using 1 gram of meth daily for the past 6 months, with last use 2 days ago.  Patient is tearful, stating he came here "knowing this is a place I can get some help."  Patient has completed residential SA Tx in [place] 5 yrs ago.  At this point he is looking for a week to two week detox/treatment options.  He denies SI, HI and AVH.  He does have some hallucinations and paranoia, "when I'm using."  He denies current AH or paranoia.  Chief Complaint: seeking detox  Visit Diagnosis: Amphetamine-type Substance Use Disorder                             Substance Induced Mood Disorder  Patient Reported Information How did you hear about [place]? Self  What Is the Reason for Your Visit/Call Today? Patient is a 37 y.o. male with a hx of Amphetamine-type Substance Use Disorder, Severe and depression who [facility] detox treatment.  Patient reports using 1 gram of  meth daily for the past 6 months, with last use 2 days ago.  Patient is tearful, stating he came here "knowing this is a place I can get some help."  Patient has completed residential SA Tx in [place] 5 yrs ago.  At this point he is looking for a week to two week detox/treatment options.  He denies SI, HI and AVH.  He does have some hallucinations and paranoia, "when I'm using."  He denies current AH or paranoia.  How Long Has This Been Causing You Problems? > than 6 months  What Do You Feel Would Help You the Most Today? Alcohol or Drug Use Treatment; Treatment for Depression or other mood problem   Have You Recently Had Any Thoughts About Hurting Yourself? No  Are You Planning to Commit Suicide/Harm Yourself At This time? No   Have you Recently Had Thoughts About Hurting Someone Karolee Ohs? No  Are You Planning to Harm Someone at This Time? No  Explanation: No data recorded  Have You Used Any Alcohol or Drugs in the Past 24 Hours? No  How Long Ago Did You Use Drugs or Alcohol? No data recorded What Did You Use and How Much? 1 gram meth   Do You Currently Have a Therapist/Psychiatrist? No  Name of Therapist/Psychiatrist: N/A   Have You Been Recently Discharged From Any Office Practice or Programs? No  Explanation of Discharge From Practice/Program: N/A    CCA Screening Triage Referral Assessment Type of Contact: Face-to-Face  Telemedicine Service Delivery:   Is this Initial or  Reassessment?   Date Telepsych consult ordered in [facility]:    Time Telepsych consult ordered in [facility]:    Location of Assessment: [facility] Assessment Services  Provider Location: [facility] Assessment Services    Collateral Involvement: N/A   Does Patient Have a Automotive engineer Guardian? No data recorded Name and Contact of Legal Guardian: No data recorded If Minor and Not Living with Parent(s), Who has Custody? N/A  Is CPS involved or ever been involved? Never  Is APS involved or ever been involved?  Never   Patient Determined To Be At Risk for Harm To Self or Others Based on Review of Patient Reported Information or Presenting Complaint? No  Method: -- (N/A, no HI)  Availability of Means: -- (N/A, no HI)  Intent: -- (N/A, no HI)  Notification Required: -- (N/A, no HI)  Additional Information for Danger to Others Potential: -- (N/A, no HI)  Additional Comments for Danger to Others Potential: N/A, no HI  Are There Guns or Other Weapons in Your Home? No  Types of Guns/Weapons: N/A  Are These Weapons Safely Secured?                            -- (N/A)  Who Could Verify You Are Able To Have These Secured: N/A  Do You Have any Outstanding Charges, Pending Court Dates, Parole/Probation? None  Contacted To Inform of Risk of Harm To Self or Others: -- (N/A, no HI)   Does Patient Present under Involuntary Commitment? No    [place] of Residence: [place]   Patient Currently Receiving the Following Services: Not Receiving Services   Determination of Need: Urgent (48 hours)   Options For Referral: Facility-Based Crisis   Discharge Disposition:     Thomas [name], [facility]

## 2022-09-28 NOTE — ED Notes (Signed)
Patient was admitted to [facility] today. Patient denies SI/HI and AVH. Patient was oriented to the unit. Patient ate his dinner and was given his medication. Patient reports he would like residential treatment. Patient is being monitored for safety.
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
## 2022-09-28 NOTE — ED Notes (Signed)
Pt is in his room resting in bed. Pt denies SI/HI/AVH. No acute distress noted. Will continue to monitor for safety. 

## 2022-09-28 NOTE — ED Notes (Signed)
Patient discharged to [facility] 

## 2022-09-29 ENCOUNTER — Encounter (HOSPITAL_COMMUNITY): Payer: Self-pay

## 2022-09-29 DIAGNOSIS — F1994 Other psychoactive substance use, unspecified with psychoactive substance-induced mood disorder: Secondary | ICD-10-CM | POA: Diagnosis not present

## 2022-09-29 DIAGNOSIS — F1290 Cannabis use, unspecified, uncomplicated: Secondary | ICD-10-CM

## 2022-09-29 DIAGNOSIS — F1523 Other stimulant dependence with withdrawal: Secondary | ICD-10-CM

## 2022-09-29 LAB — HEMOGLOBIN A1C
Hgb A1c MFr Bld: 5.1 % (ref 4.8–5.6)
Mean Plasma Glucose: 100 mg/dL

## 2022-09-29 LAB — PROLACTIN: Prolactin: 14.6 ng/mL (ref 3.9–22.7)

## 2022-09-29 NOTE — ED Notes (Signed)
Patient is sleeping. Respirations equal and unlabored, skin warm and dry. No change in assessment or acuity. Routine safety checks conducted according to facility protocol. Will continue to monitor for safety.   

## 2022-09-29 NOTE — BH IP Treatment Plan (Signed)
Interdisciplinary Treatment and Diagnostic Plan Update  09/29/2022 Time of Session: 10:15AM Omere Carroll MRN: [id number]  Diagnosis:  Final diagnoses:  Substance induced mood disorder (HCC)  Polysubstance abuse (HCC)  Severe stimulant use disorder (HCC)  Cannabis use disorder  Stimulant withdrawal (HCC)     Current Medications:  Current Facility-Administered Medications  Medication Dose Route Frequency Provider Last Rate Last Admin   acetaminophen (TYLENOL) tablet 650 mg  650 mg Oral Q6H PRN Rankin, Shuvon B, NP       alum & mag hydroxide-simeth (MAALOX/MYLANTA) 200-200-20 MG/5ML suspension 30 mL  30 mL Oral Q4H PRN Rankin, Shuvon B, NP       gabapentin (NEURONTIN) capsule 200 mg  200 mg Oral TID Rankin, Shuvon B, NP   200 mg at 09/29/22 1507   hydrOXYzine (ATARAX) tablet 25 mg  25 mg Oral TID PRN Rankin, Shuvon B, NP       magnesium hydroxide (MILK OF MAGNESIA) suspension 30 mL  30 mL Oral Daily PRN Rankin, Shuvon B, NP       ondansetron (ZOFRAN) tablet 4 mg  4 mg Oral Q8H PRN Rankin, Shuvon B, NP       traZODone (DESYREL) tablet 50 mg  50 mg Oral QHS PRN Rankin, Shuvon B, NP       No current outpatient medications on file.   PTA Medications: Prior to Admission medications   Not on File    Patient Stressors: Marital or family conflict   Substance abuse   Other: homelessness    Patient Strengths: General fund of knowledge  Motivation for treatment/growth   Treatment Modalities: Medication Management, Group therapy, Case management,  1 to 1 session with clinician, Psychoeducation, Recreational therapy.   Physician Treatment Plan for Primary and Secondary Diagnosis:  Final diagnoses:  Substance induced mood disorder (HCC)  Polysubstance abuse (HCC)  Severe stimulant use disorder (HCC)  Cannabis use disorder  Stimulant withdrawal (HCC)   Long Term Goal(s): Improvement in symptoms so as ready for discharge  Short Term Goals: Patient will verbalize feelings in  meetings with treatment team members. Patient will attend at least of 50% of the groups daily. Pt will complete the PHQ9 on admission, day 3 and discharge. Patient will take medications as prescribed daily.  Medication Management: Evaluate patient's response, side effects, and tolerance of medication regimen.  Therapeutic Interventions: 1 to 1 sessions, Unit Group sessions and Medication administration.  Evaluation of Outcomes: Progressing  LCSW Treatment Plan for Primary Diagnosis:  Final diagnoses:  Substance induced mood disorder (HCC)  Polysubstance abuse (HCC)  Severe stimulant use disorder (HCC)  Cannabis use disorder  Stimulant withdrawal (HCC)    Long Term Goal(s): Safe transition to appropriate next level of care at discharge.  Short Term Goals: Facilitate acceptance of mental health diagnosis and concerns through verbal commitment to aftercare plan and appointments at discharge., Patient will identify one social support prior to discharge to aid in patient's recovery., Patient will attend AA/NA groups as scheduled., Identify minimum of 2 triggers associated with mental health/substance abuse issues with treatment team members., and Increase skills for wellness and recovery by attending 50% of scheduled groups.  Therapeutic Interventions: Assess for all discharge needs, 1 to 1 time with Child psychotherapist, Explore available resources and support systems, Assess for adequacy in community support network, Educate family and significant other(s) on suicide prevention, Complete Psychosocial Assessment, Interpersonal group therapy.  Evaluation of Outcomes: Progressing   Progress in Treatment: Attending groups: Yes. Participating in groups: Yes. Taking  medication as prescribed: Yes. Toleration medication: Yes. Family/Significant other contact made: No, will contact:  Patient declined collateral at this time. LCSW will follow up at a later time.  Patient understands diagnosis:  Yes. Discussing patient identified problems/goals with staff: Yes. Medical problems stabilized or resolved: Yes. Denies suicidal/homicidal ideation: Yes. Issues/concerns per patient self-inventory: Yes. Other: substance use and need for further treatment.  New problem(s) identified: No, Describe:  other than reported on admission.   New Short Term/Long Term Goal(s): Safe transition to appropriate next level of care at discharge, Engage patient in therapeutic group addressing interpersonal concerns. Engage patient in aftercare planning with referrals and resources, Increase ability to appropriately verbalize feelings, Facilitate acceptance of mental health diagnosis and concerns and Identify triggers associated with mental health/substance abuse issues.   Patient Goals: Patient is seeking residential placement at this time for substance use and is hopeful to secure placement within the next 3-5 days.   Discharge Plan or Barriers: LCSW will send referrals out for review for residential placement. Updates will be provided as received.   Reason for Continuation of Hospitalization: Medication stabilization Withdrawal symptoms  Estimated Length of Stay: 3-5 days  Last 3 [place] Suicide Severity Risk Score: Flowsheet Row ED from 09/28/2022 in [facility] ED from 09/08/2017 in [facility] Emergency Department at [facility] Admission (Discharged) [facility]  C-SSRS RISK CATEGORY No Risk Low Risk Low Risk       Last PHQ 2/9 Scores:    09/28/2022    6:12 PM  Depression screen PHQ 2/9  Decreased Interest 3  Down, Depressed, Hopeless 3  PHQ - 2 Score 6  Altered sleeping 2  Tired, decreased energy 3  Change in appetite 1  Feeling bad or failure about yourself  1  Trouble concentrating 3  Moving slowly or fidgety/restless 0  Suicidal thoughts 0  PHQ-9 Score 16    Scribe for Treatment Team: Lenny Pastel 09/29/2022 4:09 PM

## 2022-09-29 NOTE — Group Note (Signed)
Group Topic: Core Beliefs  Group Date: 09/29/2022 Start Time: 1330 End Time: 1430 Facilitators: Lenny Pastel  Department: [facility]  Number of Participants: 5  Group Focus: clarity of thought Treatment Modality:  Cognitive Behavioral Therapy Interventions utilized were clarification, mental fitness, story telling, and support Purpose: explore maladaptive thinking, express feelings, increase insight, regain self-worth, relapse prevention strategies, and trigger / craving management  Name: Thomas [name] Date of Birth: 03-12-1986  MR: [id number]    Level of Participation: active Quality of Participation: quiet, however improved with interaction Interactions with others: reserved  Mood/Affect: flat Triggers (if applicable): Family members  Cognition: limited  Progress: Gaining insight Response: Patient participated in group on today. Patient expressed understanding of group rules and confidentiality. Patient was able to mention his first thought regarding certain words and stigmas attached to mental health and substance use. Patient reports this activity challenged him to think differently about his current circumstance and to reconsider options for coping with said stigmas. No issues to report.  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

## 2022-09-29 NOTE — ED Notes (Signed)
Pt is currently sleeping, no distress noted, environmental check complete, will continue to monitor patient for safety.  

## 2022-09-29 NOTE — Group Note (Signed)
Group Topic: Balance in Life  Group Date: 09/29/2022 Start Time: 1700 End Time: 1725 Facilitators: Vonzell Schlatter B  Department: [facility]  Number of Participants: 6  Group Focus: daily focus Treatment Modality:  Psychoeducation Interventions utilized were story telling Purpose: express feelings and reinforce self-care  Name: Thomas [name] Date of Birth: 07-27-1985  MR: [id number]    Level of Participation: minimal Quality of Participation: attentive and cooperative Interactions with others: gave feedback Mood/Affect: positive Triggers (if applicable): n/a Cognition: coherent/clear Progress: Moderate Response: n/a Plan: follow-up needed  Patients Problems:  Patient Active Problem List   Diagnosis Date Noted   Polysubstance abuse (HCC) 09/28/2022   Substance induced mood disorder (HCC) 09/28/2022   Suicidal ideation    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

## 2022-09-29 NOTE — Group Note (Unsigned)
Group Topic: Balance in Life  Group Date: 09/29/2022 Start Time: 2030 End Time: 2130 Facilitators: Rae Lips B  Department: [facility]  Number of Participants: 3  Group Focus: activities of daily living skills Treatment Modality:  Individual Therapy Interventions utilized were leisure development Purpose: improve communication skills  Name: Thomas [name] Date of Birth: 08-Oct-1985  MR: [id number]    Level of Participation: Did not attend group Quality of Participation: NA Interactions with others: Na Mood/Affect: NA Triggers (if applicable): NA Cognition: NA Progress: Other Response: NA Plan: patient will be encouraged to Keep going to groups.   

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
## 2022-09-29 NOTE — ED Notes (Signed)
Patient asleep in bed without distress or complaint.  Will monitor.  
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
## 2022-09-30 DIAGNOSIS — F1994 Other psychoactive substance use, unspecified with psychoactive substance-induced mood disorder: Secondary | ICD-10-CM | POA: Diagnosis not present

## 2022-09-30 LAB — HIV ANTIBODY (ROUTINE TESTING W REFLEX): HIV Screen 4th Generation wRfx: NONREACTIVE

## 2022-09-30 NOTE — Group Note (Signed)
Group Topic: Wellness  Group Date: 09/30/2022 Start Time: 1430 End Time: 1515 Facilitators: Maeola Sarah  Department: [facility]  Number of Participants: 7  Group Focus: other resilience Treatment Modality:  Psychoeducation Interventions utilized were patient education Purpose: enhance coping skills, increase insight, and reinforce self-care  Name: Noe Goyer Date of Birth: 07/25/1985  MR: [id number]    Level of Participation: active Quality of Participation: attentive, cooperative, and engaged Interactions with others: gave feedback Mood/Affect: appropriate and positive Triggers (if applicable): N/A Cognition: coherent/clear Progress: Gaining insight Response: Patient listened and shared along with staff about what resilience is and ways to build resilience. Plan: patient will be encouraged to continue to attend groups  Patients Problems:  Patient Active Problem List   Diagnosis Date Noted   Polysubstance abuse (HCC) 09/28/2022   Substance induced mood disorder (HCC) 09/28/2022   Suicidal ideation    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

## 2022-09-30 NOTE — ED Notes (Signed)
Pt is in the dayroom aving AA Meeting with peers and AA representatives. Pt denies SI/HI/AVH. No acute distress noted. Will continue to monitor for safety.  

## 2022-09-30 NOTE — Group Note (Signed)
Group Topic: Relapse and Recovery  Group Date: 09/30/2022 Start Time: 2000 End Time: 2100 Facilitators: Emmit Pomfret D, NT  Department: [facility]  Number of Participants: 8  Group Focus: relapse prevention Treatment Modality:  Psychoeducation Interventions utilized were support Purpose: relapse prevention strategies  Name: Thomas [name] Date of Birth: 1985/07/30  MR: [id number]    Level of Participation: moderate Quality of Participation: attentive Interactions with others: gave feedback Mood/Affect: appropriate Triggers (if applicable): n/a Cognition: concrete Progress: Significant Response: n/a Plan: follow-up needed  Patients Problems:  Patient Active Problem List   Diagnosis Date Noted   Polysubstance abuse (HCC) 09/28/2022   Substance induced mood disorder (HCC) 09/28/2022   Suicidal ideation    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

## 2022-09-30 NOTE — ED Notes (Signed)
Patient was provided with dinner 

## 2022-09-30 NOTE — ED Notes (Signed)
Pt is currently sleeping, no distress noted, environmental check complete, will continue to monitor patient for safety.  

## 2022-09-30 NOTE — Progress Notes (Signed)
Pt is awake, alert and oriented X4. Pt complained of headache. No signs of acute distress noted. PRN Tylenol and scheduled meds administered with no issue. Pt denies current SI/HI/AVH, plan or intent. Staff will monitor for pt's safety.
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
## 2022-09-30 NOTE — Discharge Planning (Signed)
LCSW followed up with [facility] Admissions Coordinator Skylar who reports patient would be a good candidate for their treatment facility. Per Danna Hefty, transportation can be arranged via [facility] for the patient to arrive to their facility on Friday 10/02/2022. Agency will call morning of for ETA. LCSW spoke with patient to confirm if he is in agreement with plan. Patient is agreeable to plan and expressed appreciation for LCSW assistance. No other needs were reported by patient or facility. 30 day script needed and medications can be filled at their facility.   Fernande Boyden, LCSW Clinical Social Worker [place] [facility] Ph: [phone]

## 2022-09-30 NOTE — Progress Notes (Signed)
Pt was visible in the milieu and was observed interacting with peers. No distress noted or concerns voiced. Staff will monitor for pt's safety. 

## 2022-09-30 NOTE — ED Notes (Signed)
Patient was provided with breakfast 

## 2022-09-30 NOTE — ED Notes (Signed)
Patient was provided with lunch 

## 2022-09-30 NOTE — Progress Notes (Signed)
LCSW followed up with patient on this morning to explore if he has made contact with [facility] to complete phone screening as requested. Patient observed lying in bed and stated he has not called yet but will on today. LCSW encouraged the patient to make the phone call on this morning as his options will be limited due to insurance. Patient expressed understanding and stated he would call shortly. LCSW will follow up at later time on today to confirm.   Fernande Boyden, LCSW Clinical Social Worker [place] [facility] Ph: [phone]

## 2022-09-30 NOTE — ED Notes (Signed)
Patient is sleeping. Respirations equal and unlabored, skin warm and dry. No change in assessment or acuity. Routine safety checks conducted according to facility protocol. Will continue to monitor for safety.   

## 2022-09-30 NOTE — Discharge Instructions (Signed)
Patient will be discharging to [facility] on Friday 10/02/2022 with transportation provided by [facility]. Address: [address] [place], [place] [address]; [phone]   Primary Care Provider Options  [facility] [address], [place], [place] [address] Monday-Friday: 8 AM-5:30 PM Phone [phone]  [facility] [address] [place], [place], [address] Monday-Friday: 8 AM-4:30 PM Phone [phone] Family medicine  Patient Care at [facility] [address], [place], [place] [address] Monday-Friday: 8 AM-5 PM Phone [phone] [facility] medicine  [facility] [address], [place], [place] [address] Monday-Friday: 8 AM-5:30 PM Phone: [phone] Primary CARE  [facility] [address], [place], [address] [phone] phone   New Patient Assessment/Therapy Walk-Ins:  Monday and Wednesday: 8 am until slots are full. Every 1st and 2nd Fridays of the month: 1 pm - 5 pm.  NO ASSESSMENT/THERAPY WALK-INS ON TUESDAYS OR THURSDAYS  New Patient Assessment/Medication Management Walk-Ins:  Monday - Friday:  8 am - 11 am.  For all walk-ins, we ask that you arrive by 7:30 am because patients will be seen in the order of arrival.  Availability is limited; therefore, you may not be seen on the same day that you walk-in.  Our goal is to serve and meet the needs of our community to the best of our ability.  12 STEP PROGRAMS:  Alcoholics Anonymous of [place] [employer]  Narcotics Anonymous of [place] [employer]  Al-Anon of [place], [place] www.[place]alanon.org/find-meetings.html  Nar-Anon https://nar-anon.org/find-a-meetin  Base on the information you have provided and the presenting issue, outpatient services with therapy and psychiatry have been recommended.  It is imperative that you follow through with  treatment recommendations within 5-7 days from the of discharge to mitigate further risk to your safety and mental well-being. A list of referrals has been provided below to get you started.  You are not limited to the list provided.  In case of an urgent crisis, you may contact the Mobile Crisis Unit with [facility] at 1.401-705-1301.  Outpatient Therapy and Psychiatry for Medicare Recipients  [facility] [address] [place], [place], [address] [phone] phone  [facility] [address] [place], [place], [address] [address] phone ([address], [employer] [address] - [place], [address], [place], [place], [employer], [address], [employer] Healthy [place], [place], [address], [place], [place], [place], [employer], [employer], [employer], [employer], [place])  [facility] [address] [place], [place], [address] [phone] phone  [facility] [address] [place], [place], [address] [phone] phone  [facility] [address] [place], [place], [address] [phone] phone [phone] fax  [facility], [place] [address], [place], [address] [phone] phone  [facility] [address] [place], [place], [address] [phone] phone [phone] fax  [facility] [address] [place], [place], [address] [phone] phone  Jovita Kussmaul [address] [place], [place], [address] [phone] phone  [facility] [address] [place], [place], [address] [phone] phone [phone] fax

## 2022-10-01 DIAGNOSIS — F1994 Other psychoactive substance use, unspecified with psychoactive substance-induced mood disorder: Secondary | ICD-10-CM | POA: Diagnosis not present

## 2022-10-01 MED ORDER — GABAPENTIN 100 MG PO CAPS: 100.0000 mg | ORAL_CAPSULE | Freq: Two times a day (BID) | ORAL | 0 refills | Status: AC | PRN

## 2022-10-01 MED ORDER — GABAPENTIN 100 MG PO CAPS: 100.0000 mg | ORAL_CAPSULE | Freq: Two times a day (BID) | ORAL | Status: DC | PRN

## 2022-10-01 MED ORDER — GABAPENTIN 300 MG PO CAPS: 300.0000 mg | ORAL_CAPSULE | Freq: Every day | ORAL | 0 refills | Status: AC

## 2022-10-01 MED ORDER — TRAZODONE HCL 50 MG PO TABS: 50.0000 mg | ORAL_TABLET | Freq: Every evening | ORAL | 0 refills | Status: AC | PRN

## 2022-10-01 MED ORDER — GABAPENTIN 300 MG PO CAPS
300.0000 mg | ORAL_CAPSULE | Freq: Every day | ORAL | Status: DC
  Administered 2022-10-01: 300 mg via ORAL
  Filled 2022-10-01: qty 1

## 2022-10-01 NOTE — ED Notes (Signed)
Pt sleeping in no acute distress. RR even and unlabored. Environment secured. Will continue to monitor for safety. 

## 2022-10-01 NOTE — Group Note (Signed)
Group Topic: Core Beliefs  Group Date: 10/01/2022 Start Time: 0200 End Time: 0330 Facilitators: Lenny Pastel  Department: [facility]  Number of Participants: 8  Group Focus: acceptance, activities of daily living skills, chemical dependency issues, clarity of thought, communication, coping skills, daily focus, feeling awareness/expression, forgiveness, healthy friendships, impulsivity, leisure skills, personal responsibility, problem solving, relapse prevention, self-awareness, and self-esteem Treatment Modality:  Cognitive Behavioral Therapy and Solution-Focused Therapy Interventions utilized were exploration, group exercise, mental fitness, problem solving, story telling, and support Purpose: enhance coping skills, explore maladaptive thinking, express feelings, express irrational fears, improve communication skills, increase insight, regain self-worth, reinforce self-care, relapse prevention strategies, and trigger / craving management  Name: Thomas [name] Date of Birth: 09/05/1985  MR: [id number]    Level of Participation: active Quality of Participation: attentive, cooperative, engaged, offered feedback, and supportive Interactions with others: gave feedback Mood/Affect: appropriate Triggers (if applicable): Anxiety Cognition: coherent/clear, goal directed, insightful, and logical Progress: Gaining insight Summary of Progress/Problems: Patient actively participated in group on today. Group started off with introductions and group rules. Group members participated in a therapeutic activity that required active listening and communication skills. Group members were able to identify similarities and differences within the group. Patient interacted positively with staff and peers. Patient reports he plans to change his environment and make better choices than before. Patient was receptive to the feedback provided. No issues to report.  Plan: referral  / recommendations  Patients Problems:  Patient Active Problem List   Diagnosis Date Noted   Polysubstance abuse (HCC) 09/28/2022   Substance induced mood disorder (HCC) 09/28/2022   Suicidal ideation    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

## 2022-10-01 NOTE — ED Notes (Signed)
Pt observed/assessed in room sleeping. RR even and unlabored, appearing in no noted distress. Environmental check complete, will continue to monitor for safety 

## 2022-10-01 NOTE — ED Notes (Signed)
Pt a/o x3. Denies SI/HI/AVH. Denies s/s of withdrawal. States he is tired and wanted to rest. He is pleasant and engaged. No noted distress. Will continue to monitor for safety
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
## 2022-10-01 NOTE — Group Note (Signed)
Group Topic: Feelings about Diagnosis  Group Date: 10/01/2022 Start Time: 1135 End Time: 1215 Facilitators: Vonzell Schlatter B  Department: [facility]  Number of Participants: 7  Group Focus: feeling awareness/expression and goals/reality orientation Treatment Modality:  Psychoeducation Interventions utilized were patient education and problem solving Purpose: increase insight and trigger / craving management  Name: Thomas [name] Date of Birth: 11-Jul-1985  MR: [id number]    Level of Participation: active Quality of Participation: attentive and cooperative Interactions with others: gave feedback Mood/Affect: positive Triggers (if applicable): n/a Cognition: coherent/clear Progress: Moderate Response: n/a Plan: follow-up needed  Patients Problems:  Patient Active Problem List   Diagnosis Date Noted   Polysubstance abuse (HCC) 09/28/2022   Substance induced mood disorder (HCC) 09/28/2022   Suicidal ideation    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

## 2022-10-01 NOTE — ED Notes (Signed)
Patient A&Ox4. Denies intent to harm self/others when asked. Denies A/VH. Patient denies any physical complaints when asked. No acute distress noted. Pt states, "I'm catching up on some rest. I was out there on drugs for 14 days with no sleep. I guess it's catching up with me now because I feel off schedule". Support and encouragement provided. Routine safety checks conducted according to facility protocol. Encouraged patient to notify staff if thoughts of harm toward self or others arise. Patient verbalize understanding and agreement. Will continue to monitor for safety.

## 2022-10-01 NOTE — ED Notes (Signed)
Patient is sleeping. Respirations equal and unlabored, skin warm and dry. No change in assessment or acuity. Routine safety checks conducted according to facility protocol. Will continue to monitor for safety.   
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
## 2022-10-02 DIAGNOSIS — F1994 Other psychoactive substance use, unspecified with psychoactive substance-induced mood disorder: Secondary | ICD-10-CM

## 2022-10-02 NOTE — ED Notes (Signed)
Pt observed/assessed in room sleeping. RR even and unlabored, appearing in no noted distress. Environmental check complete, will continue to monitor for safety 

## 2022-10-02 NOTE — ED Notes (Signed)
Pt was given breakfast

## 2022-10-02 NOTE — Progress Notes (Signed)
Pt is awake, alert and oriented X4. Pt did not voice any complaints of pain or discomfort. No signs of acute distress noted. Pt denies current SI/HI/AVH, plan or intent. Staff will monitor for pt's safety. 

## 2022-10-02 NOTE — Discharge Planning (Signed)
LCSW spoke with patient this morning to inform him that [facility] will be here by 10:30am to transport him to the facility. Patient reports appreciation for LCSW assistance with locating placement for him. Patient reports he looks forward to continuing his journey towards recovery. Patient aware that additional resources have been added to his AVS for his follow up. Patient was also encouraged to reach out to the Case Manager at the facility regarding continued care after treatment. Patient expressed understanding. No other needs were reported at this time. LCSW to sign off. Please inform if further LCSW needs arise prior to discharge.   Fernande Boyden, LCSW Clinical Social Worker [facility] Ph: [phone]

## 2022-10-02 NOTE — Progress Notes (Signed)
Pt is resting quietly in his room. No distress noted. Staff will monitor for pt's safety.

## 2022-10-02 NOTE — Progress Notes (Signed)
[name] to be D/C'd  to [facility]  per MD order. Discussed with the patient and all questions fully answered. An After Visit Summary was printed and given to the patient. Medication scripts and all belongings were given to patient. Patient escorted out and D/C home via private auto.  Thomas [name]  10/02/2022 10:59 AM
